# Patient Record
Sex: Female | Born: 1990 | Race: Black or African American | Hispanic: No | Marital: Single | Smoking: Never smoker
Health system: Southern US, Community
[De-identification: ages and names within clinical notes are randomized; demographics above are authoritative.]

## PROBLEM LIST (undated history)

## (undated) ENCOUNTER — Inpatient Hospital Stay (HOSPITAL_COMMUNITY): Payer: Self-pay

## (undated) DIAGNOSIS — N12 Tubulo-interstitial nephritis, not specified as acute or chronic: Secondary | ICD-10-CM

## (undated) DIAGNOSIS — F329 Major depressive disorder, single episode, unspecified: Secondary | ICD-10-CM

## (undated) DIAGNOSIS — N390 Urinary tract infection, site not specified: Secondary | ICD-10-CM

## (undated) DIAGNOSIS — F419 Anxiety disorder, unspecified: Secondary | ICD-10-CM

## (undated) DIAGNOSIS — F32A Depression, unspecified: Secondary | ICD-10-CM

## (undated) HISTORY — PX: INDUCED ABORTION: SHX677

---
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
## 2010-04-08 ENCOUNTER — Emergency Department (HOSPITAL_COMMUNITY): Admission: EM | Admit: 2010-04-08 | Discharge: 2010-04-08 | Payer: Self-pay | Admitting: Family Medicine

## 2010-04-13 ENCOUNTER — Emergency Department (HOSPITAL_COMMUNITY): Admission: EM | Admit: 2010-04-13 | Discharge: 2010-04-13 | Payer: Self-pay | Admitting: Emergency Medicine

## 2010-08-31 LAB — POCT URINALYSIS DIPSTICK
Nitrite: NEGATIVE
Protein, ur: >=300 mg/dL — AB
Urobilinogen, UA: 0.2 mg/dL (ref 0.0–1.0)
pH: 7.5 (ref 5.0–8.0)

## 2010-08-31 LAB — WET PREP, GENITAL
Trich, Wet Prep: NONE SEEN
Yeast Wet Prep HPF POC: NONE SEEN

## 2010-08-31 LAB — URINE CULTURE

## 2010-08-31 LAB — GC/CHLAMYDIA PROBE AMP, GENITAL: GC Probe Amp, Genital: NEGATIVE

## 2010-09-02 ENCOUNTER — Inpatient Hospital Stay (HOSPITAL_COMMUNITY)
Admission: AD | Admit: 2010-09-02 | Discharge: 2010-09-02 | Disposition: A | Discharge status: Home / Self Care | Payer: Medicaid Other | Source: Ambulatory Visit | Attending: Obstetrics | Admitting: Obstetrics

## 2010-09-02 DIAGNOSIS — O2390 Unspecified genitourinary tract infection in pregnancy, unspecified trimester: Secondary | ICD-10-CM | POA: Insufficient documentation

## 2010-09-02 DIAGNOSIS — R109 Unspecified abdominal pain: Secondary | ICD-10-CM | POA: Insufficient documentation

## 2010-09-02 DIAGNOSIS — A499 Bacterial infection, unspecified: Secondary | ICD-10-CM | POA: Insufficient documentation

## 2010-09-02 DIAGNOSIS — N760 Acute vaginitis: Secondary | ICD-10-CM | POA: Insufficient documentation

## 2010-09-02 DIAGNOSIS — B9689 Other specified bacterial agents as the cause of diseases classified elsewhere: Secondary | ICD-10-CM | POA: Insufficient documentation

## 2010-09-02 LAB — URINE MICROSCOPIC-ADD ON

## 2010-09-02 LAB — URINALYSIS, ROUTINE W REFLEX MICROSCOPIC
Glucose, UA: NEGATIVE mg/dL
Ketones, ur: NEGATIVE mg/dL
Protein, ur: NEGATIVE mg/dL

## 2010-09-02 LAB — WET PREP, GENITAL: Clue Cells Wet Prep HPF POC: NONE SEEN

## 2010-09-04 LAB — URINALYSIS, ROUTINE W REFLEX MICROSCOPIC
Bilirubin Urine: NEGATIVE
Hgb urine dipstick: NEGATIVE
Nitrite: NEGATIVE
Specific Gravity, Urine: 1.019 (ref 1.005–1.030)
Urobilinogen, UA: 1 mg/dL (ref 0.0–1.0)
pH: 7.5 (ref 5.0–8.0)

## 2010-09-04 LAB — CBC
HCT: 36.8 % (ref 36.0–46.0)
MCV: 92.8 fL (ref 78.0–100.0)
RBC: 3.97 MIL/uL (ref 3.87–5.11)
WBC: 7.3 10*3/uL (ref 4.0–10.5)

## 2010-09-04 LAB — URINE CULTURE
Colony Count: 50000
Colony Count: >=100000
Culture  Setup Time: 201203161043

## 2010-09-04 LAB — URINE MICROSCOPIC-ADD ON

## 2010-09-04 LAB — POCT URINALYSIS DIP (DEVICE)
Bilirubin Urine: NEGATIVE
Nitrite: NEGATIVE
Protein, ur: NEGATIVE mg/dL
Urobilinogen, UA: 0.2 mg/dL (ref 0.0–1.0)
pH: 8.5 — ABNORMAL HIGH (ref 5.0–8.0)

## 2010-09-04 LAB — WET PREP, GENITAL
Trich, Wet Prep: NONE SEEN
Yeast Wet Prep HPF POC: NONE SEEN

## 2010-09-04 LAB — DIFFERENTIAL
Eosinophils Absolute: 0.1 10*3/uL (ref 0.0–0.7)
Eosinophils Relative: 1 % (ref 0–5)
Lymphocytes Relative: 28 % (ref 12–46)
Lymphs Abs: 2 10*3/uL (ref 0.7–4.0)
Monocytes Relative: 9 % (ref 3–12)

## 2010-09-04 LAB — BASIC METABOLIC PANEL
Chloride: 106 mEq/L (ref 96–112)
GFR calc Af Amer: >60 mL/min (ref >60)
GFR calc non Af Amer: >60 mL/min (ref >60)
Potassium: 2.8 mEq/L — ABNORMAL LOW (ref 3.5–5.1)
Sodium: 134 mEq/L — ABNORMAL LOW (ref 135–145)

## 2010-09-04 LAB — POCT PREGNANCY, URINE: Preg Test, Ur: POSITIVE

## 2010-09-04 LAB — GC/CHLAMYDIA PROBE AMP, GENITAL
Chlamydia, DNA Probe: NEGATIVE
Chlamydia, DNA Probe: POSITIVE — AB
GC Probe Amp, Genital: NEGATIVE

## 2010-09-19 LAB — POCT URINALYSIS DIP (DEVICE)
Ketones, ur: NEGATIVE mg/dL
Nitrite: NEGATIVE
Protein, ur: 100 mg/dL — AB
Urobilinogen, UA: 1 mg/dL (ref 0.0–1.0)
pH: 6.5 (ref 5.0–8.0)

## 2010-09-19 LAB — URINE CULTURE

## 2010-10-01 ENCOUNTER — Observation Stay: Payer: Self-pay | Admitting: Obstetrics and Gynecology

## 2010-10-25 ENCOUNTER — Inpatient Hospital Stay (HOSPITAL_COMMUNITY)
Admission: AD | Admit: 2010-10-25 | Discharge: 2010-10-25 | Disposition: A | Discharge status: Home / Self Care | Payer: Medicaid Other | Source: Ambulatory Visit | Attending: Obstetrics | Admitting: Obstetrics

## 2010-10-25 DIAGNOSIS — O2390 Unspecified genitourinary tract infection in pregnancy, unspecified trimester: Secondary | ICD-10-CM

## 2010-10-25 DIAGNOSIS — N390 Urinary tract infection, site not specified: Secondary | ICD-10-CM

## 2010-10-25 DIAGNOSIS — R109 Unspecified abdominal pain: Secondary | ICD-10-CM

## 2010-10-25 LAB — URINALYSIS, ROUTINE W REFLEX MICROSCOPIC
Bilirubin Urine: NEGATIVE
Glucose, UA: NEGATIVE mg/dL
Ketones, ur: NEGATIVE mg/dL
Protein, ur: 100 mg/dL — AB

## 2010-10-25 LAB — URINE MICROSCOPIC-ADD ON

## 2010-10-27 LAB — URINE CULTURE: Culture  Setup Time: 201205090538

## 2010-11-16 ENCOUNTER — Inpatient Hospital Stay (HOSPITAL_COMMUNITY)
Admission: AD | Admit: 2010-11-16 | Discharge: 2010-11-16 | Disposition: A | Discharge status: Home / Self Care | Payer: Medicaid Other | Source: Ambulatory Visit | Attending: Obstetrics & Gynecology | Admitting: Obstetrics & Gynecology

## 2010-11-16 DIAGNOSIS — R109 Unspecified abdominal pain: Secondary | ICD-10-CM | POA: Insufficient documentation

## 2010-11-16 DIAGNOSIS — N390 Urinary tract infection, site not specified: Secondary | ICD-10-CM

## 2010-11-16 DIAGNOSIS — O2390 Unspecified genitourinary tract infection in pregnancy, unspecified trimester: Secondary | ICD-10-CM

## 2010-11-16 LAB — URINALYSIS, ROUTINE W REFLEX MICROSCOPIC
Bilirubin Urine: NEGATIVE
Specific Gravity, Urine: 1.015 (ref 1.005–1.030)
Urobilinogen, UA: 0.2 mg/dL (ref 0.0–1.0)

## 2010-11-16 LAB — URINE MICROSCOPIC-ADD ON

## 2010-11-24 ENCOUNTER — Inpatient Hospital Stay (HOSPITAL_COMMUNITY)
Admission: AD | Admit: 2010-11-24 | Discharge: 2010-11-27 | DRG: 775 | Disposition: A | Discharge status: Home / Self Care | Payer: Medicaid Other | Source: Ambulatory Visit | Attending: Obstetrics | Admitting: Obstetrics

## 2010-11-24 ENCOUNTER — Inpatient Hospital Stay (HOSPITAL_COMMUNITY)
Admission: EM | Admit: 2010-11-24 | Discharge: 2010-11-24 | Disposition: A | Discharge status: Home / Self Care | Payer: Medicaid Other | Source: Ambulatory Visit | Attending: Obstetrics | Admitting: Obstetrics

## 2010-11-24 ENCOUNTER — Inpatient Hospital Stay (HOSPITAL_COMMUNITY)
Admission: EM | Admit: 2010-11-24 | Discharge: 2010-11-24 | Disposition: A | Discharge status: Home / Self Care | Payer: Self-pay | Source: Ambulatory Visit | Attending: Obstetrics | Admitting: Obstetrics

## 2010-11-24 DIAGNOSIS — O479 False labor, unspecified: Secondary | ICD-10-CM | POA: Insufficient documentation

## 2010-11-25 LAB — CBC
MCV: 84.4 fL (ref 78.0–100.0)
Platelets: 293 10*3/uL (ref 150–400)
RBC: 4.04 MIL/uL (ref 3.87–5.11)
WBC: 12.4 10*3/uL — ABNORMAL HIGH (ref 4.0–10.5)

## 2010-11-25 LAB — RPR: RPR Ser Ql: NONREACTIVE

## 2010-11-26 LAB — CBC
HCT: 27.8 % — ABNORMAL LOW (ref 36.0–46.0)
Hemoglobin: 9.2 g/dL — ABNORMAL LOW (ref 12.0–15.0)
MCH: 28.1 pg (ref 26.0–34.0)
MCHC: 33.1 g/dL (ref 30.0–36.0)
MCV: 85 fL (ref 78.0–100.0)
Platelets: 228 K/uL (ref 150–400)
RBC: 3.27 MIL/uL — ABNORMAL LOW (ref 3.87–5.11)
RDW: 13.6 % (ref 11.5–15.5)
WBC: 14.2 K/uL — ABNORMAL HIGH (ref 4.0–10.5)

## 2011-03-16 LAB — POCT PREGNANCY, URINE
Operator id: 294591
Preg Test, Ur: NEGATIVE

## 2011-03-16 LAB — RPR: RPR Ser Ql: NONREACTIVE

## 2011-10-02 ENCOUNTER — Encounter (HOSPITAL_BASED_OUTPATIENT_CLINIC_OR_DEPARTMENT_OTHER): Payer: Self-pay | Admitting: *Deleted

## 2011-10-02 ENCOUNTER — Emergency Department (HOSPITAL_BASED_OUTPATIENT_CLINIC_OR_DEPARTMENT_OTHER)
Admission: EM | Admit: 2011-10-02 | Discharge: 2011-10-03 | Disposition: A | Discharge status: Home / Self Care | Payer: Medicaid Other | Attending: Emergency Medicine | Admitting: Emergency Medicine

## 2011-10-02 DIAGNOSIS — R350 Frequency of micturition: Secondary | ICD-10-CM | POA: Insufficient documentation

## 2011-10-02 DIAGNOSIS — O2390 Unspecified genitourinary tract infection in pregnancy, unspecified trimester: Secondary | ICD-10-CM | POA: Insufficient documentation

## 2011-10-02 DIAGNOSIS — O2343 Unspecified infection of urinary tract in pregnancy, third trimester: Secondary | ICD-10-CM

## 2011-10-02 DIAGNOSIS — B373 Candidiasis of vulva and vagina: Secondary | ICD-10-CM

## 2011-10-02 DIAGNOSIS — B3731 Acute candidiasis of vulva and vagina: Secondary | ICD-10-CM | POA: Insufficient documentation

## 2011-10-02 DIAGNOSIS — R109 Unspecified abdominal pain: Secondary | ICD-10-CM | POA: Insufficient documentation

## 2011-10-02 DIAGNOSIS — N390 Urinary tract infection, site not specified: Secondary | ICD-10-CM | POA: Insufficient documentation

## 2011-10-02 DIAGNOSIS — R300 Dysuria: Secondary | ICD-10-CM | POA: Insufficient documentation

## 2011-10-02 DIAGNOSIS — R3915 Urgency of urination: Secondary | ICD-10-CM | POA: Insufficient documentation

## 2011-10-02 DIAGNOSIS — N949 Unspecified condition associated with female genital organs and menstrual cycle: Secondary | ICD-10-CM | POA: Insufficient documentation

## 2011-10-02 HISTORY — DX: Urinary tract infection, site not specified: N39.0

## 2011-10-02 NOTE — ED Notes (Signed)
Pt reports symptoms began approx 1 week ago and worsened 2 days ago. C/o lower abdominal cramping with thick/white discharge. Reports swollen/irritated labia. Denies bleeding. Denies N/V/D or fevers. Pt is 23mo pregnant. EDD June 2. Pt does not currently have an OB doctor. G2P1

## 2011-10-03 LAB — URINE MICROSCOPIC-ADD ON

## 2011-10-03 LAB — URINALYSIS, ROUTINE W REFLEX MICROSCOPIC
Protein, ur: NEGATIVE mg/dL
Urobilinogen, UA: 0.2 mg/dL (ref 0.0–1.0)

## 2011-10-03 LAB — WET PREP, GENITAL: Trich, Wet Prep: NONE SEEN

## 2011-10-03 MED ORDER — CLOTRIMAZOLE 1 % VA CREA: 1.0000 | TOPICAL_CREAM | Freq: Every day | VAGINAL | Status: AC

## 2011-10-03 MED ORDER — NITROFURANTOIN MONOHYD MACRO 100 MG PO CAPS: 100.0000 mg | ORAL_CAPSULE | Freq: Two times a day (BID) | ORAL | Status: AC

## 2011-10-03 MED ORDER — NITROFURANTOIN MONOHYD MACRO 100 MG PO CAPS
100.0000 mg | ORAL_CAPSULE | Freq: Once | ORAL | Status: AC
  Administered 2011-10-03: 100 mg via ORAL
  Filled 2011-10-03: qty 1

## 2011-10-03 NOTE — ED Provider Notes (Signed)
This chart was scribed for Alisha Numbers, MD by Wallis Mart. The patient was seen in room MH07/MH07 and the patient's care was started at 12:48 AM.   CSN: [id number]  Arrival date & time 10/02/11  2327   First MD Initiated Contact with Patient 10/03/11 0006      Chief Complaint  Patient presents with  . Abdominal Pain    (Consider location/radiation/quality/duration/timing/severity/associated sxs/prior treatment) HPI   Alisha Wall is a 69 G2P1 y.o. female who presents to the Emergency Department complaining of sudden onset, persistence of constant, gradually worsening, moderate lower abdominal cramping onset one week ago. The cramping has worsened over the last 2 days.  Pt is 7 months pregnant, and her last period was in October.  Pt has not seen an OBGYN for the pregnancy, has not had an [place]. Pt c/o a thick white discharge that started one week ago as well.  Pt c/o dysuria today.   Denies bleeding, N/V/D.    There are no other associated symptoms and no other alleviating or aggravating factors.   Past Medical History  Diagnosis Date  . UTI (lower urinary tract infection)     History reviewed. No pertinent past surgical history.  No family history on file.  History  Substance Use Topics  . Smoking status: Never Smoker   . Smokeless tobacco: Not on file  . Alcohol Use: No    OB History    Grav Para Term Preterm Abortions TAB SAB Ect Mult Living   1               Review of Systems  Constitutional: Negative.   HENT: Negative.   Eyes: Negative.   Respiratory: Negative.   Cardiovascular: Negative.   Gastrointestinal: Positive for abdominal pain. Negative for nausea, vomiting, diarrhea and constipation.  Genitourinary: Positive for dysuria, urgency, frequency, vaginal discharge and vaginal pain. Negative for vaginal bleeding.  Musculoskeletal: Negative.   Skin: Negative.   Neurological: Negative.   Hematological: Negative.   Psychiatric/Behavioral: Negative.     All other systems reviewed and are negative.    10 Systems reviewed and all are negative for acute change except as noted in the HPI.    Allergies  Review of patient's allergies indicates no known allergies.  Home Medications   Current Outpatient Rx  Name Route Sig Dispense Refill  . PRENATAL MULTIVITAMIN CH Oral Take 1 tablet by mouth daily.      BP 118/73  Pulse 88  Temp(Src) 97.6 F (36.4 C) (Oral)  Resp 18  Ht 5\' 5"  (1.651 m)  Wt 146 lb (66.225 kg)  BMI 24.30 kg/m2  SpO2 100%  Physical Exam  Nursing note and vitals reviewed. Constitutional: She is oriented to person, place, and time. She appears well-developed and well-nourished. No distress.  HENT:  Head: Normocephalic and atraumatic.  Eyes: EOM are normal. Pupils are equal, round, and reactive to light.  Neck: Normal range of motion. Neck supple. No tracheal deviation present.  Cardiovascular: Normal rate and regular rhythm.   Pulmonary/Chest: Effort normal and breath sounds normal. No respiratory distress.  Abdominal: Soft. Bowel sounds are normal. She exhibits no distension. There is no tenderness.       Gravid abdomen   Genitourinary: Pelvic exam was performed with patient supine. There is tenderness on the right labia. There is tenderness on the left labia. Uterus is enlarged. Cervix exhibits no motion tenderness and no discharge. Right adnexum displays no mass and no tenderness. Left adnexum displays no mass  and no tenderness. There is erythema and tenderness around the vagina. No bleeding around the vagina. Vaginal discharge found.       Patient with gravid uterus, cervix is soft but thick and closed. Thick whitish cottage cheese like discharge  Musculoskeletal: Normal range of motion. She exhibits no edema.  Neurological: She is alert and oriented to person, place, and time. No cranial nerve deficit or sensory deficit. She exhibits normal muscle tone. Coordination normal.  Skin: Skin is warm and dry.   Psychiatric: She has a normal mood and affect. Her behavior is normal.    ED Course  Procedures (including critical care time) DIAGNOSTIC STUDIES: Oxygen Saturation is 100% on room air, normal by my interpretation.    COORDINATION OF CARE:   Labs Reviewed  URINALYSIS, ROUTINE W REFLEX MICROSCOPIC - Abnormal; Notable for the following:    APPearance CLOUDY (*)    Hgb urine dipstick SMALL (*)    Leukocytes, UA LARGE (*)    All other components within normal limits  WET PREP, GENITAL - Abnormal; Notable for the following:    Yeast Wet Prep HPF POC FEW (*)    Clue Cells Wet Prep HPF POC FEW (*)    WBC, Wet Prep HPF POC MANY (*) MANY BACTERIA SEEN   All other components within normal limits  URINE MICROSCOPIC-ADD ON - Abnormal; Notable for the following:    Squamous Epithelial / LPF MANY (*)    Bacteria, UA MANY (*)    All other components within normal limits  URINE CULTURE  GC/CHLAMYDIA PROBE AMP, GENITAL   No results found.   1. UTI (urinary tract infection) in pregnancy in third trimester   2. Yeast vaginitis       MDM  Patient was evaluated by myself. She was hemodynamically stable. Patient denied cramping being similar to prior contractions. Pelvic exam was concerning for a yeast infection. Patient was written for intravaginal cream for this. Patient's urine did show bacteriuria and the patient complained of dysuria. She was given a dose of Macrobid here and was discharged with prescription for this. Given patient's advanced stage of pregnancy quick bedside ultrasound was performed which showed a single gestation with positive fetal cardiac activity. This was performed by myself. Formal ultrasound was not available at this hour. Patient did have monitoring of the fetus performed which was transmitted to [facility]. This is felt to be unremarkable. Patient was told that she must seek an OB/GYN. She was on prenatal vitamins. Patient was discharged home in good condition  with contact information for Dr. Debroah Loop, that OB/GYN with whom I discussed her case, as well as [facility]. Patient was followed by [facility] during her previous pregnancy. Patient was discharged in good condition.   I personally performed the services described in this documentation, which was scribed in my presence. The recorded information has been reviewed and considered.        Alisha Numbers, MD 10/03/11 [phone]

## 2011-10-03 NOTE — Progress Notes (Signed)
Monitors removed by ED staff for patient discharge. Patient discharged from [facility]

## 2011-10-03 NOTE — Discharge Instructions (Signed)
Candidal Vulvovaginitis Candidal vulvovaginitis is an infection of the vagina and vulva. The vulva is the skin around the opening of the vagina. This may cause itching and discomfort in and around the vagina.  HOME CARE  Only take medicine as told by your doctor.   Do not have sex (intercourse) until the infection is healed or as told by your doctor.   Practice safe sex.   Tell your sex partner about your infection.   Do not douche or use tampons.   Wear cotton underwear. Do not wear tight pants or panty hose.   Eat yogurt. This may help treat and prevent yeast infections.  GET HELP RIGHT AWAY IF:   You have a fever.   Your problems get worse during treatment or do not get better in 3 days.   You have discomfort, irritation, or itching in your vagina or vulva area.   You have pain after sex.   You start to get belly (abdominal) pain.  MAKE SURE YOU:  Understand these instructions.   Will watch your condition.   Will get help right away if you are not doing well or get worse.  Document Released: 09/01/2008 Document Revised: 05/25/2011 Document Reviewed: 09/01/2008 [facility] Patient Information 2012 [place], [place].Urinary Tract Infection in Pregnancy A urinary tract infection (UTI) is a bacterial infection of the urinary tract. Infection of the urinary tract can include the ureters, kidneys (pyelonephritis), bladder (cystitis), and urethra (urethritis). All pregnant women should be screened for bacteria in the urinary tract. Identifying and treating a UTI will decrease the risk of preterm labor and developing more serious infections in both the mother and baby. CAUSES Bacteria germs cause almost all UTIs. There are many factors that can increase your chances of getting a UTI during pregnancy. These include:  Having a short urethra.   Poor toilet and hygiene habits.   Sexual intercourse.   Blockage of urine along the urinary tract.   Problems with the pelvic muscles or  nerves.   Diabetes.   Obesity.   Bladder problems after having several children.   Previous history of UTI.  SYMPTOMS   Pain, burning, or a stinging feeling when urinating.   Suddenly feeling the need to urinate right away (urgency).   Loss of bladder control (urinary incontinence).   Frequent urination, more than is common with pregnancy.   Lower abdominal or back discomfort.   Bad smelling urine.   Cloudy urine.   Blood in the urine (hematuria).   Fever.  When the kidneys are infected, the symptoms may be:  Back pain.   Flank pain on the right side more so than the left.   Fever.   Chills.   Nausea.   Vomiting.  DIAGNOSIS   Urine tests.   Additional tests and procedures may include:   Ultrasound of the kidneys, ureters, bladder, and urethra.   Looking in the bladder with a lighted tube (cystoscopy).   Certain X-ray studies only when absolutely necessary.  Finding out the results of your test Ask when your test results will be ready. Make sure you get your test results. TREATMENT  Antibiotic medicine by mouth.   Antibiotics given through the vein (intravenously), if needed.  HOME CARE INSTRUCTIONS   Take your antibiotics as directed. Finish them even if you start to feel better. Only take medicine as directed by your caregiver.   Drink enough fluids to keep your urine clear or pale yellow.   Do not have sexual intercourse until the  infection is gone and your caregiver says it is okay.   Make sure you are tested for UTIs throughout your pregnancy if you get one. These infections often come back.  Preventing a UTI in the future:  Practice good toilet habits. Always wipe from front to back. Use the tissue only once.   Do not hold your urine. Empty your bladder as soon as possible when the urge comes.   Do not douche or use deodorant sprays.   Wash with soap and warm water around the genital area and the anus.   Empty your bladder before  and after sexual intercourse.   Wear underwear with a cotton crotch.   Avoid caffeine and carbonated drinks. They can irritate the bladder.   Drink cranberry juice or take cranberry pills. This may decrease the risk of getting a UTI.   Do not drink alcohol.   Keep all your appointments and tests as scheduled.  SEEK MEDICAL CARE IF:   Your symptoms get worse.   You are still having fevers 2 or more days after treatment begins.   You develop a rash.   You feel that you are having problems with medicines prescribed.   You develop abnormal vaginal discharge.  SEEK IMMEDIATE MEDICAL CARE IF:   You develop back or flank pain.   You develop chills.   You have blood in your urine.   You develop nausea and vomiting.   You develop contractions of your uterus.   You have a gush of fluid from the vagina.  MAKE SURE YOU:   Understand these instructions.   Will watch your condition.   Will get help right away if you are not doing well or get worse.  Document Released: 09/30/2010 Document Revised: 05/25/2011 Document Reviewed: 09/30/2010 [facility] Patient Information 2012 [place], [place].

## 2011-10-03 NOTE — Progress Notes (Signed)
Called from [facility] for monitoring. Patient admitted to [facility] for asssessment.

## 2011-10-03 NOTE — Progress Notes (Signed)
Spoke with Lurena Joiner, RN at [facility] to further assess uterine activity seen on monitor and patient has 61 month old child sitting at bedside with movement. RN denies any breathing or change in facial expression. RN states abdomen has remained soft.

## 2011-10-03 NOTE — ED Notes (Signed)
Spoke to Alisha Wall with [facility] and state they will review the OB monitor and call back with report.

## 2011-10-04 LAB — URINE CULTURE: Culture: NO GROWTH

## 2011-10-04 LAB — GC/CHLAMYDIA PROBE AMP, GENITAL
Chlamydia, DNA Probe: POSITIVE — AB
GC Probe Amp, Genital: NEGATIVE

## 2011-10-05 NOTE — ED Notes (Signed)
+   chlamydia Chart sent to edp office for review. 

## 2011-10-07 NOTE — ED Notes (Addendum)
Chart returned from EDP office. Prescribed Azithromycin 1000mg  PO x 1. Follow-up with OB/GYN. Prescribed by Felicie Morn NPC.

## 2011-10-08 NOTE — ED Notes (Signed)
Called patient and informed them of + result and new RX. RX called to [facility] on [address] [phone]). RX called in by Norm Parcel PFM.

## 2011-10-12 ENCOUNTER — Telehealth (HOSPITAL_BASED_OUTPATIENT_CLINIC_OR_DEPARTMENT_OTHER): Payer: Self-pay | Admitting: *Deleted

## 2011-10-12 NOTE — ED Notes (Signed)
[facility] Department called requesting results of labs, urine, and ultrasound.  Chart reviewed and copy of ED notes and labs sent.  No official ultra sound done per EDP notes.

## 2011-11-15 ENCOUNTER — Inpatient Hospital Stay (HOSPITAL_COMMUNITY)
Admission: AD | Admit: 2011-11-15 | Discharge: 2011-11-15 | Disposition: A | Discharge status: Home / Self Care | Payer: Medicaid Other | Source: Ambulatory Visit | Attending: Obstetrics and Gynecology | Admitting: Obstetrics and Gynecology

## 2011-11-15 ENCOUNTER — Encounter (HOSPITAL_COMMUNITY): Payer: Self-pay | Admitting: *Deleted

## 2011-11-15 DIAGNOSIS — R109 Unspecified abdominal pain: Secondary | ICD-10-CM | POA: Insufficient documentation

## 2011-11-15 DIAGNOSIS — N949 Unspecified condition associated with female genital organs and menstrual cycle: Secondary | ICD-10-CM

## 2011-11-15 DIAGNOSIS — O99891 Other specified diseases and conditions complicating pregnancy: Secondary | ICD-10-CM | POA: Insufficient documentation

## 2011-11-15 LAB — URINALYSIS, ROUTINE W REFLEX MICROSCOPIC
Bilirubin Urine: NEGATIVE
Nitrite: NEGATIVE
Specific Gravity, Urine: <1.005 — ABNORMAL LOW (ref 1.005–1.030)
pH: 7.5 (ref 5.0–8.0)

## 2011-11-15 LAB — URINE MICROSCOPIC-ADD ON

## 2011-11-15 NOTE — MAU Provider Note (Signed)
Agree with above note.  Alisha Wall 11/15/2011 7:41 AM

## 2011-11-15 NOTE — MAU Note (Signed)
K. Shaw, CNM at bedside.  Assessment done and poc discussed with pt.  

## 2011-11-15 NOTE — MAU Note (Signed)
PT SAYS  SHE IS HAVING CRAMPS IN ABD  X3 DAYS-  DRANK WATER.  HER BACK STARTED HURTING YESTERDAY.   HER LEGS  BELOW HER KNEES   HURT X2 WEEKS.  [facility] [place]-  SEEN LAST ON 5-17-   EVERYTHING WAS OK.   SHE WENT TO [facility] ON 4-15-  FOR UTI AND TESTED POSTIVE  FOR CHLYMADIA- AND WAS TREATED.

## 2011-11-15 NOTE — MAU Provider Note (Signed)
Alisha Wall is a 21 y.o. female presenting for eval of right-sided abd cramping and leg cramping tonight. Denies leak, bldg, H/A, N/V or visual disturbances. She was dx with chlam in MAU 09/2011. She is a pt at [facility] health dept and reports routine [facility] so far. History OB History    Grav Para Term Preterm Abortions TAB SAB Ect Mult Living   2 1 1       1      Past Medical History  Diagnosis Date  . UTI (lower urinary tract infection)   . No pertinent past medical history    History reviewed. No pertinent past surgical history. Family History: family history is not on file. Social History:  reports that she has never smoked. She does not have any smokeless tobacco history on file. She reports that she does not drink alcohol or use illicit drugs.  ROS    Blood pressure 87/73, pulse 82, temperature 97.8 F (36.6 C), temperature source Oral, resp. rate 20, height 5\' 5"  (1.651 m), weight 67.359 kg (148 lb 8 oz). Maternal Exam:  Uterine Assessment: None per toco  Cervix: Cx FT/50%/-3  Fetal Exam Fetal Monitor Review: Baseline rate: 140.  Variability: moderate (6-25 bpm).   Pattern: accelerations present and no decelerations.       Physical Exam  Constitutional: She is oriented to person, place, and time. She appears well-developed and well-nourished.  HENT:  Head: Normocephalic.  Neck: Normal range of motion.  Cardiovascular: Normal rate.   Respiratory: Effort normal.  Musculoskeletal: Normal range of motion.  Neurological: She is alert and oriented to person, place, and time.  Skin: Skin is warm and dry.  Psychiatric: She has a normal mood and affect. Her behavior is normal.    Urinalysis    Component Value Date/Time   COLORURINE YELLOW 11/15/2011 0225   APPEARANCEUR CLEAR 11/15/2011 0225   LABSPEC <1.005* 11/15/2011 0225   PHURINE 7.5 11/15/2011 0225   GLUCOSEU NEGATIVE 11/15/2011 0225   HGBUR TRACE* 11/15/2011 0225   BILIRUBINUR NEGATIVE 11/15/2011 0225   KETONESUR  NEGATIVE 11/15/2011 0225   PROTEINUR NEGATIVE 11/15/2011 0225   UROBILINOGEN 0.2 11/15/2011 0225   NITRITE NEGATIVE 11/15/2011 0225   LEUKOCYTESUR LARGE* 11/15/2011 0225     Prenatal labs: ABO, Rh:   Antibody:   Rubella:   RPR: NON REACTIVE (06/08 0018)  HBsAg:    HIV:    GBS:     Assessment/Plan: IUP at 33.5 Round lig pain  D/C home with comfort tips rev'd GC/chlam pending F/U with sched prenatal appt later today     Efrat Zuidema 11/15/2011, 3:23 AM

## 2011-11-15 NOTE — Discharge Instructions (Signed)

## 2011-11-16 LAB — GC/CHLAMYDIA PROBE AMP, URINE: Chlamydia, Swab/Urine, PCR: NEGATIVE

## 2011-12-18 ENCOUNTER — Telehealth (HOSPITAL_COMMUNITY): Payer: Self-pay | Admitting: *Deleted

## 2011-12-18 NOTE — Telephone Encounter (Signed)
Preadmission screen  

## 2012-10-21 ENCOUNTER — Emergency Department (HOSPITAL_COMMUNITY)
Admission: EM | Admit: 2012-10-21 | Discharge: 2012-10-21 | Disposition: A | Discharge status: Home / Self Care | Payer: Medicaid Other | Attending: Emergency Medicine | Admitting: Emergency Medicine

## 2012-10-21 ENCOUNTER — Encounter (HOSPITAL_COMMUNITY): Payer: Self-pay | Admitting: *Deleted

## 2012-10-21 DIAGNOSIS — N760 Acute vaginitis: Secondary | ICD-10-CM

## 2012-10-21 DIAGNOSIS — Z87440 Personal history of urinary (tract) infections: Secondary | ICD-10-CM | POA: Insufficient documentation

## 2012-10-21 DIAGNOSIS — N390 Urinary tract infection, site not specified: Secondary | ICD-10-CM

## 2012-10-21 DIAGNOSIS — Z3202 Encounter for pregnancy test, result negative: Secondary | ICD-10-CM | POA: Insufficient documentation

## 2012-10-21 LAB — WET PREP, GENITAL

## 2012-10-21 LAB — LIPASE, BLOOD: Lipase: 46 U/L (ref 11–59)

## 2012-10-21 LAB — CBC WITH DIFFERENTIAL/PLATELET
Basophils Absolute: 0 10*3/uL (ref 0.0–0.1)
Basophils Relative: 0 % (ref 0–1)
Eosinophils Relative: 2 % (ref 0–5)
Lymphocytes Relative: 22 % (ref 12–46)
MCHC: 36.1 g/dL — ABNORMAL HIGH (ref 30.0–36.0)
Neutro Abs: 5.3 10*3/uL (ref 1.7–7.7)
Platelets: 329 10*3/uL (ref 150–400)
RDW: 12.1 % (ref 11.5–15.5)
WBC: 7.7 10*3/uL (ref 4.0–10.5)

## 2012-10-21 LAB — URINALYSIS, ROUTINE W REFLEX MICROSCOPIC
Bilirubin Urine: NEGATIVE
Ketones, ur: NEGATIVE mg/dL
Nitrite: NEGATIVE
Protein, ur: NEGATIVE mg/dL
Urobilinogen, UA: 1 mg/dL (ref 0.0–1.0)
pH: 6.5 (ref 5.0–8.0)

## 2012-10-21 LAB — BASIC METABOLIC PANEL
CO2: 28 mEq/L (ref 19–32)
Calcium: 9.7 mg/dL (ref 8.4–10.5)
Creatinine, Ser: 0.62 mg/dL (ref 0.50–1.10)
GFR calc Af Amer: >90 mL/min (ref >90)
Sodium: 138 mEq/L (ref 135–145)

## 2012-10-21 LAB — URINE MICROSCOPIC-ADD ON

## 2012-10-21 MED ORDER — SULFAMETHOXAZOLE-TRIMETHOPRIM 800-160 MG PO TABS: 1.0000 | ORAL_TABLET | Freq: Two times a day (BID) | ORAL | Status: DC

## 2012-10-21 MED ORDER — SULFAMETHOXAZOLE-TMP DS 800-160 MG PO TABS
1.0000 | ORAL_TABLET | Freq: Once | ORAL | Status: AC
  Administered 2012-10-21: 1 via ORAL
  Filled 2012-10-21: qty 1

## 2012-10-21 MED ORDER — IBUPROFEN 800 MG PO TABS
800.0000 mg | ORAL_TABLET | Freq: Once | ORAL | Status: AC
  Administered 2012-10-21: 800 mg via ORAL
  Filled 2012-10-21: qty 1

## 2012-10-21 MED ORDER — METRONIDAZOLE 500 MG PO TABS
500.0000 mg | ORAL_TABLET | Freq: Once | ORAL | Status: AC
  Administered 2012-10-21: 500 mg via ORAL
  Filled 2012-10-21: qty 1

## 2012-10-21 MED ORDER — ACETAMINOPHEN 325 MG PO TABS
650.0000 mg | ORAL_TABLET | Freq: Once | ORAL | Status: AC
  Administered 2012-10-21: 650 mg via ORAL
  Filled 2012-10-21: qty 2

## 2012-10-21 MED ORDER — METRONIDAZOLE 500 MG PO TABS: 500.0000 mg | ORAL_TABLET | Freq: Two times a day (BID) | ORAL | Status: DC

## 2012-10-21 NOTE — ED Provider Notes (Signed)
History     CSN: [id number]  Arrival date & time 10/21/12  1327   First MD Initiated Contact with Patient 10/21/12 1541      Chief Complaint  Patient presents with  . Abdominal Pain    (Consider location/radiation/quality/duration/timing/severity/associated sxs/prior treatment) The history is provided by the patient.  Alisha Wall is a 22 y.o. female history of UTI here presenting with lower abnormal pain. Lower quadrant pain for the last month. It's intermittent and crampy feeling. Last menstrual period was 2 weeks ago. She look up this morning around 5 AM with worsening right lower quadrant pain that is sharp. Denies any fevers chills or vomiting. She said the pain slightly improved now. She also had mild dysuria but no frequency or hematuria. No vaginal discharge.    Past Medical History  Diagnosis Date  . UTI (lower urinary tract infection)   . No pertinent past medical history     History reviewed. No pertinent past surgical history.  No family history on file.  History  Substance Use Topics  . Smoking status: Never Smoker   . Smokeless tobacco: Not on file  . Alcohol Use: No    OB History   Grav Para Term Preterm Abortions TAB SAB Ect Mult Living   2 1 1       1       Review of Systems  Gastrointestinal: Positive for abdominal pain.  Genitourinary: Positive for dysuria.  All other systems reviewed and are negative.    Allergies  Review of patient's allergies indicates no known allergies.  Home Medications   Current Outpatient Rx  Name  Route  Sig  Dispense  Refill  . acetaminophen (TYLENOL) 325 MG tablet   Oral   Take 325 mg by mouth every 6 (six) hours as needed for pain.           BP 110/61  Pulse 79  Temp(Src) 98.3 F (36.8 C) (Oral)  Resp 18  SpO2 98%  LMP 10/06/2012  Physical Exam  Nursing note and vitals reviewed. Constitutional: She is oriented to person, place, and time. She appears well-developed and well-nourished.  HENT:   Head: Normocephalic.  Mouth/Throat: Oropharynx is clear and moist.  Eyes: Conjunctivae are normal. Pupils are equal, round, and reactive to light.  Neck: Normal range of motion. Neck supple.  Cardiovascular: Normal rate, regular rhythm and normal heart sounds.   Pulmonary/Chest: Effort normal and breath sounds normal. No respiratory distress. She has no wheezes. She has no rales.  Abdominal: Soft. Bowel sounds are normal.  Mild R pelvic tenderness vs suprapubic tenderness. no tenderness periumbilical area or mcburney's point. No CVAT   Genitourinary:  Whitish discharge from vagina, cottage cheese appearance. No CMT or adnexal tenderness. + suprapubic tenderness   Musculoskeletal: Normal range of motion. She exhibits no edema and no tenderness.  Neurological: She is alert and oriented to person, place, and time.  Skin: Skin is warm and dry.  Psychiatric: She has a normal mood and affect. Her behavior is normal. Judgment and thought content normal.    ED Course  Procedures (including critical care time)  Labs Reviewed  WET PREP, GENITAL - Abnormal; Notable for the following:    Clue Cells Wet Prep HPF POC MANY (*)    WBC, Wet Prep HPF POC MODERATE (*)    All other components within normal limits  CBC WITH DIFFERENTIAL - Abnormal; Notable for the following:    MCHC 36.1 (*)    All other components  within normal limits  URINALYSIS, ROUTINE W REFLEX MICROSCOPIC - Abnormal; Notable for the following:    APPearance HAZY (*)    Specific Gravity, Urine 1.031 (*)    Leukocytes, UA MODERATE (*)    All other components within normal limits  URINE MICROSCOPIC-ADD ON - Abnormal; Notable for the following:    Squamous Epithelial / LPF FEW (*)    Bacteria, UA FEW (*)    All other components within normal limits  URINE CULTURE  GC/CHLAMYDIA PROBE AMP  BASIC METABOLIC PANEL  LIPASE, BLOOD  POCT PREGNANCY, URINE   No results found.   No diagnosis found.    MDM  Alisha Wall is a  22 y.o. female here with lower pelvic pain. Likely ruptured cyst vs UTI. Not concerned for appendicitis given no fever or WBC count and the chronicity of symptoms. Labs otherwise unremarkable. UCG neg. Will get wet prep to r/o yeast vs BV.   5:20 PM Wet prep showed + WBC and clue cells no yeast. Likely has BV. Given flagyl here. Will d/c home on bactrim and flagyl.        Richardean Canal, MD 10/21/12 [phone]

## 2012-10-21 NOTE — ED Notes (Signed)
Pt is here with RLQ abdominal pain for one month.  Pt states that at 0500 had sharp pains that made her cry.  Pt reports also having frequent chest pain.  LMP-10/06/12

## 2012-10-22 LAB — GC/CHLAMYDIA PROBE AMP: GC Probe RNA: NEGATIVE

## 2012-10-22 LAB — URINE CULTURE: Colony Count: 8000

## 2012-10-23 NOTE — ED Notes (Signed)
+  Chlamydia Chart sent to EDP office for review.  

## 2012-10-28 ENCOUNTER — Telehealth (HOSPITAL_COMMUNITY): Payer: Self-pay | Admitting: Emergency Medicine

## 2012-10-29 ENCOUNTER — Telehealth (HOSPITAL_COMMUNITY): Payer: Self-pay | Admitting: Emergency Medicine

## 2012-11-02 ENCOUNTER — Telehealth (HOSPITAL_COMMUNITY): Payer: Self-pay | Admitting: Emergency Medicine

## 2012-11-02 NOTE — ED Notes (Signed)
Unable to contact patient via phone. Sent letter. °

## 2012-12-28 ENCOUNTER — Telehealth (HOSPITAL_COMMUNITY): Payer: Self-pay | Admitting: Emergency Medicine

## 2012-12-28 NOTE — ED Notes (Signed)
No response to letter sent after 30 days. Chart sent to Medical Records. °

## 2013-09-04 ENCOUNTER — Inpatient Hospital Stay (HOSPITAL_COMMUNITY)
Admission: AD | Admit: 2013-09-04 | Discharge: 2013-09-04 | Disposition: A | Discharge status: Home / Self Care | Payer: Medicaid Other | Source: Ambulatory Visit | Attending: Obstetrics & Gynecology | Admitting: Obstetrics & Gynecology

## 2013-09-04 ENCOUNTER — Inpatient Hospital Stay (HOSPITAL_COMMUNITY): Payer: Medicaid Other

## 2013-09-04 ENCOUNTER — Encounter (HOSPITAL_COMMUNITY): Payer: Self-pay | Admitting: Emergency Medicine

## 2013-09-04 ENCOUNTER — Encounter (HOSPITAL_COMMUNITY): Payer: Self-pay | Admitting: *Deleted

## 2013-09-04 ENCOUNTER — Emergency Department (HOSPITAL_COMMUNITY)
Admission: EM | Admit: 2013-09-04 | Discharge: 2013-09-04 | Disposition: A | Discharge status: Home / Self Care | Payer: Medicaid Other | Source: Home / Self Care | Attending: Emergency Medicine | Admitting: Emergency Medicine

## 2013-09-04 ENCOUNTER — Other Ambulatory Visit (HOSPITAL_COMMUNITY)
Admission: RE | Admit: 2013-09-04 | Discharge: 2013-09-04 | Disposition: A | Discharge status: Home / Self Care | Payer: Medicaid Other | Source: Ambulatory Visit | Attending: Emergency Medicine | Admitting: Emergency Medicine

## 2013-09-04 DIAGNOSIS — Z3490 Encounter for supervision of normal pregnancy, unspecified, unspecified trimester: Secondary | ICD-10-CM

## 2013-09-04 DIAGNOSIS — Z113 Encounter for screening for infections with a predominantly sexual mode of transmission: Secondary | ICD-10-CM | POA: Insufficient documentation

## 2013-09-04 DIAGNOSIS — O418X90 Other specified disorders of amniotic fluid and membranes, unspecified trimester, not applicable or unspecified: Secondary | ICD-10-CM

## 2013-09-04 DIAGNOSIS — N9489 Other specified conditions associated with female genital organs and menstrual cycle: Secondary | ICD-10-CM | POA: Insufficient documentation

## 2013-09-04 DIAGNOSIS — O2340 Unspecified infection of urinary tract in pregnancy, unspecified trimester: Secondary | ICD-10-CM

## 2013-09-04 DIAGNOSIS — R109 Unspecified abdominal pain: Secondary | ICD-10-CM | POA: Insufficient documentation

## 2013-09-04 DIAGNOSIS — O2390 Unspecified genitourinary tract infection in pregnancy, unspecified trimester: Secondary | ICD-10-CM | POA: Insufficient documentation

## 2013-09-04 DIAGNOSIS — Z3201 Encounter for pregnancy test, result positive: Secondary | ICD-10-CM

## 2013-09-04 DIAGNOSIS — N760 Acute vaginitis: Secondary | ICD-10-CM | POA: Insufficient documentation

## 2013-09-04 DIAGNOSIS — N949 Unspecified condition associated with female genital organs and menstrual cycle: Secondary | ICD-10-CM | POA: Insufficient documentation

## 2013-09-04 DIAGNOSIS — O468X9 Other antepartum hemorrhage, unspecified trimester: Secondary | ICD-10-CM

## 2013-09-04 DIAGNOSIS — O208 Other hemorrhage in early pregnancy: Secondary | ICD-10-CM | POA: Insufficient documentation

## 2013-09-04 DIAGNOSIS — N390 Urinary tract infection, site not specified: Secondary | ICD-10-CM

## 2013-09-04 LAB — POCT URINALYSIS DIP (DEVICE)
Bilirubin Urine: NEGATIVE
Glucose, UA: NEGATIVE mg/dL
Ketones, ur: NEGATIVE mg/dL
Nitrite: NEGATIVE
PROTEIN: NEGATIVE mg/dL
SPECIFIC GRAVITY, URINE: 1.015 (ref 1.005–1.030)
UROBILINOGEN UA: 0.2 mg/dL (ref 0.0–1.0)
pH: 7 (ref 5.0–8.0)

## 2013-09-04 LAB — POCT PREGNANCY, URINE: Preg Test, Ur: POSITIVE — AB

## 2013-09-04 LAB — HCG, QUANTITATIVE, PREGNANCY: HCG, BETA CHAIN, QUANT, S: 71264 m[IU]/mL — AB (ref <5)

## 2013-09-04 MED ORDER — CEPHALEXIN 500 MG PO CAPS: 500.0000 mg | ORAL_CAPSULE | Freq: Three times a day (TID) | ORAL | Status: DC

## 2013-09-04 NOTE — ED Notes (Signed)
Pt. Instructed to go to [facility] now.  Dr. Lorenz CoasterKeller called report. 09/04/2013

## 2013-09-04 NOTE — ED Notes (Signed)
C/o abd pain for two weeks Denies any bowel or bladder problems

## 2013-09-04 NOTE — MAU Note (Signed)
Pt sent from urgent care. C/O abd pan x2 weeks. Did pelvic exam . Sent for "follow up"

## 2013-09-04 NOTE — ED Provider Notes (Signed)
Chief Complaint   Chief Complaint  Patient presents with  . Abdominal Pain    History of Present Illness   Alisha Wall is a 23 year old female who has had a one-week history of generalized myalgias, intermittent abdominal pain localized to the right flank and the lower abdomen, nausea, vomiting, dizziness, and constipation. She is just getting over an upper respiratory infection with sore throat, rhinorrhea, and fever. This was about 4 days ago and she's getting better right now. Her last menstrual period was January 15 she had a positive home pregnancy test. She denies any blood in the stool urinary symptoms. She is a gravida 3 para 2.  Review of Systems   Other than as noted above, the patient denies any of the following symptoms: Systemic:  No fever or chills GI:  No abdominal pain, nausea, vomiting, diarrhea, constipation, melena or hematochezia. GU:  No dysuria, frequency, urgency, hematuria, vaginal discharge, itching, or abnormal vaginal bleeding.  PMFSH   Past medical history, family history, social history, meds, and allergies were reviewed.    Physical Examination    Vital signs:  BP 99/60  Pulse 70  Temp(Src) 97.6 F (36.4 C) (Oral)  Resp 16  SpO2 100%  LMP 10/06/2012 General:  Alert, oriented and in no distress. Lungs:  Breath sounds clear and equal bilaterally.  No wheezes, rales or rhonchi. Heart:  Regular rhythm.  No gallops or murmers. Abdomen:  Soft, flat and non-distended.  No organomegaly or mass.  She has mild, generalized tenderness to palpation without guarding or rebound.  Bowel sounds normally active. Pelvic exam:  Normal external genitalia, vaginal and cervical mucosa were normal. There is a moderate amount of white discharge. She has mild cervical motion tenderness. Uterus is normal in size and shape and nontender. She has bilateral adnexal tenderness worse in the left in the right with no mass.  DNA probes for gonorrhea, Chlamydia, Trichomonas,  Gardnerella, Candida were obtained. Skin:  Clear, warm and dry.  Labs   Results for orders placed during the hospital encounter of 09/04/13  POCT URINALYSIS DIP (DEVICE)      Result Value Ref Range   Glucose, UA NEGATIVE  NEGATIVE mg/dL   Bilirubin Urine NEGATIVE  NEGATIVE   Ketones, ur NEGATIVE  NEGATIVE mg/dL   Specific Gravity, Urine 1.015  1.005 - 1.030   Hgb urine dipstick TRACE (*) NEGATIVE   pH 7.0  5.0 - 8.0   Protein, ur NEGATIVE  NEGATIVE mg/dL   Urobilinogen, UA 0.2  0.0 - 1.0 mg/dL   Nitrite NEGATIVE  NEGATIVE   Leukocytes, UA LARGE (*) NEGATIVE  POCT PREGNANCY, URINE      Result Value Ref Range   Preg Test, Ur POSITIVE (*) NEGATIVE    Urine was cultured.   AsIsessment   The primary encounter diagnosis was Pregnancy. A diagnosis of UTI (lower urinary tract infection) was also pertinent to this visit.  She is in early stages of pregnancy and has some pelvic pain. She needs to have an ultrasound to rule out an ectopic pregnancy. [facility] report was called, her husband is with her and will transport her immediately.       Plan    1.  Meds:  The following meds were prescribed:   New Prescriptions   CEPHALEXIN (KEFLEX) 500 MG CAPSULE    Take 1 capsule (500 mg total) by mouth 3 (three) times daily.    2.  Patient Education/Counseling:  The patient was given appropriate handouts, self care instructions, and instructed in symptomatic relief.    3.  Follow up:  The patient was told to follow up here if no better in 3 to 4 days, or sooner if becoming worse in any way, and given some red flag symptoms such as worsening pain, fever, persistent vomiting, or heavy vaginal bleeding which would prompt immediate return.  Followup at [facility] for her prenatal care.     Reuben Likes, MD 09/04/13 [phone]

## 2013-09-04 NOTE — Discharge Instructions (Signed)
[facility]--do not eat or drink.   Nausea and vomiting are common during the first 3 months of pregnancy.  This will almost always get better as pregnancy progresses.  There are a number of things you can do to decrease symptoms:   Avoidance of large meals and consumption of low-fat, low-fiber, bland foods (e.g., breads, crackers, cereals, eggs, tofu, lean meat, peanut butter, fruits, vegetables). Avoidance of foods with strong smells and those with increased protein and liquid content is often recommended. Eating light food (such as saltine crackers) early in the morning before getting out of bed can be helpful.   Ginger extract has been shown to be helpful.  The dose is 125 to 250 mg every 6 hours.  Ginger ale or ginger tea may be an acceptable alternative.   The first line medications for morning sickness are a combination of Vitamin B 6 (Pyridoxine ) 10 to 25 mg every 8 hours.  (Side effects are numbness, headache and fatigue) plus Doxylamine (Unisom Sleep Tabs) 12.5 - 25 mg every 8 hours (Side effect is drowsiness).  It has been established that this is safe to take during pregnancy.   There are a number of prescription medications available, so if symptoms persist despite these treatments, see your doctor.   Persistent vomiting especially with severe weakness or dizziness may indicate dehydration.  If this happens, you should go to [facility].   Before [facility] Ask any questions about feeding, diapering, and baby care before you leave the hospital. Ask again if you do not understand. Ask when you need to see the doctor again. There are several things you must have before your baby comes home. Infant car seat. Crib. Do not let your baby sleep in a bed with you or anyone else. If you do not have a bed for your baby, ask the doctor what you can use that will be safe for the baby to sleep in. Infant feeding supplies: 6 to 8 bottles (8 oz. size). 6 to 8  nipples. Measuring cup. Measuring tablespoon. Bottle brush. Sterilizer (or use any large pan or kettle with a lid). Formula that contains iron. A way to boil and cool water. Breastfeeding supplies: Breast pump. Nipple cream. Clothing: 24 to 36 cloth diapers and waterproof diaper covers or a box of disposable diapers. You may need as many as 10 to 12 diapers per day. 3 onesies (other clothing will depend on the time of year and the weather). 3 receiving blankets. 3 baby pajamas or gowns. 3 bibs. Bath equipment: Mild soap. Petroleum jelly. No baby oil or powder. Soft cloth towel and wash cloth. Cotton balls. Separate bath basin for baby. Only sponge bathe until umbilical cord and circumcision are healed. Other supplies: Thermometer and bulb syringe (ask the hospital to send them home with you). Ask your doctor about how you should take your baby's temperature. One to two pacifiers. Prepare for an emergency: Know how to get to the hospital and know where to admit your baby. Put all doctor numbers near your house phone and in your cell phone if you have one. Prepare your family: Talk with siblings about the baby coming home and how they feel about it. Decide how you want to handle visitors and other family members. Take offers for help with the baby. You will need time to adjust. Know when to call the doctor.  GET HELP RIGHT AWAY IF: Your baby's temperature is greater than 100.4 F (38 C).  The softspot on your baby's head starts to bulge. Your baby is crying with no tears or has no wet diapers for 6 hours. Your baby has rapid breathing. Your baby is not as alert. Document Released: 05/18/2008 Document Revised: 08/28/2011 Document Reviewed: 08/25/2010 [facility] Patient Information 2014 [place], [place].  Pregnancy, First Trimester The first trimester is the first 3 months your baby is growing inside you. It is important to follow your doctor's instructions. HOME CARE  Do not  smoke. Do not drink alcohol. Only take medicine as told by your doctor. Exercise. Eat healthy foods. Eat regular, well-balanced meals. You can have sex (intercourse) if there are no other problems with the pregnancy. Things that help with morning sickness: Eat soda crackers before getting up in the morning. Eat 4 to 5 small meals rather than 3 large meals. Drink liquids between meals, not during meals. Go to all appointments as told. Take all vitamins or supplements as told by your doctor. GET HELP RIGHT AWAY IF:  You develop a fever. You have a bad smelling fluid that is leaking from your vagina. There is bleeding from the vagina. You develop severe belly (abdominal) or back pain. You throw up (vomit) blood. It may look like coffee grounds. You lose more than 2 pounds in a week. You gain 5 pounds or more in a week. You gain more than 2 pounds in a week and you see puffiness (swelling) in your feet, ankles, or legs. You have severe dizziness or pass out (faint). You are around people who have [place] measles, chickenpox, or fifth disease. You have a headache, watery poop (diarrhea), pain with peeing (urinating), or cannot breath right. Document Released: 11/22/2007 Document Revised: 08/28/2011 Document Reviewed: 11/22/2007 [facility] Patient Information 2014 [place], [place].  Pregnancy If you are planning on getting pregnant, it is a good idea to make a preconception appointment with your caregiver to discuss having a healthy lifestyle before getting pregnant. This includes diet, weight, exercise, taking prenatal vitamins (especially folic acid, which helps prevent brain and spinal cord defects), avoiding alcohol, smoking and illegal drugs, medical problems (diabetes, convulsions), family history of genetic problems, working conditions, and immunizations. It is better to have knowledge of these things and do something about them before getting pregnant. During your pregnancy, it is  important to follow certain guidelines in order to have a healthy baby. It is very important to get good prenatal care and follow your caregiver's instructions. Prenatal care includes all the medical care you receive before your baby's birth. This helps to prevent problems during the pregnancy and childbirth. HOME CARE INSTRUCTIONS  Start your prenatal visits by the 12th week of pregnancy or earlier, if possible. At first, appointments are usually scheduled monthly. They become more frequent in the last 2 months before delivery. It is important that you keep your caregiver's appointments and follow your caregiver's instructions regarding medication use, exercise, and diet. During pregnancy, you are providing food for you and your baby. Eat a regular, well-balanced diet. Choose foods such as meat, fish, milk and other dairy products, vegetables, fruits, whole-grain breads and cereals. Your caregiver will inform you of the ideal weight gain depending on your current height and weight. Drink lots of liquids. Try to drink 8 glasses of water a day. Alcohol is associated with a number of birth defects including fetal alcohol syndrome. It is best to avoid alcohol completely. Smoking will cause low birth rate and prematurity. Use of alcohol and nicotine during your pregnancy also increases the  chances that your child will be chemically dependent later in their life and may contribute to SIDS (Sudden Infant Death Syndrome). Do not use illegal drugs. Only take prescription or over-the-counter medications that are recommended by your caregiver. Other medications can cause genetic and physical problems in the baby. Morning sickness can often be helped by keeping soda crackers at the bedside. Eat a few before getting up in the morning. A sexual relationship may be continued until near the end of pregnancy if there are no other problems such as early (premature) leaking of amniotic fluid from the membranes, vaginal  bleeding, painful intercourse or belly (abdominal) pain. Exercise regularly. Check with your caregiver if you are unsure of the safety of some of your exercises. Do not use hot tubs, steam rooms or saunas. These increase the risk of fainting and hurting yourself and the baby. Swimming is OK for exercise. Get plenty of rest, including afternoon naps when possible, especially in the third trimester. Avoid toxic odors and chemicals. Do not wear high heels. They may cause you to lose your balance and fall. Do not lift over 5 pounds. If you do lift anything, lift with your legs and thighs, not your back. Avoid long trips, especially in the third trimester. If you have to travel out of the city or state, take a copy of your medical records with you. SEEK IMMEDIATE MEDICAL CARE IF:  You develop an unexplained oral temperature above 102 F (38.9 C), or as your caregiver suggests. You have leaking of fluid from the vagina. If leaking membranes are suspected, take your temperature and inform your caregiver of this when you call. There is vaginal spotting or bleeding. Notify your caregiver of the amount and how many pads are used. You continue to feel sick to your stomach (nauseous) and have no relief from remedies suggested, or you throw up (vomit) blood or coffee ground like materials. You develop upper abdominal pain. You have round ligament discomfort in the lower abdominal area. This still must be evaluated by your caregiver. You feel contractions of the uterus. You do not feel the baby move, or there is less movement than before. You have painful urination. You have abnormal vaginal discharge. You have persistent diarrhea. You get a severe headache. You have problems with your vision. You develop muscle weakness. You feel dizzy and faint. You develop shortness of breath. You develop chest pain. You have back pain that travels down to your leg and feet. You feel irregular or a very fast  heartbeat. You develop excessive weight gain in a short period of time (5 pounds in 3 to 5 days). You are involved in a domestic violence situation. Document Released: 06/05/2005 Document Revised: 12/05/2011 Document Reviewed: 11/27/2008 [facility] Patient Information 2014 [place], [place].

## 2013-09-04 NOTE — MAU Provider Note (Signed)
History     CSN: [id number]  Arrival date and time: 09/04/13 1810   None     Chief Complaint  Patient presents with  . Abdominal Pain   HPI  Pt is a 23 yo G2P1001 at [redacted]w[redacted]d wks IUP sent from urgent care for ultrasound.  Pt reports lower mid-pelvic pain that started two weeks ago and that has gotten progressively worse.  Pain is described as pressure.  Pt denies vaginal bleeding or abnormal discharge.  Large leukocytes on UA at urgent care, being sent for culture. GC/CT and wet prep collected at urgent care and sent to lab.  Pt reports nausea, vomiting, and diarrhea over a week ago, but none today.    Past Medical History  Diagnosis Date  . UTI (lower urinary tract infection)   . No pertinent past medical history     No past surgical history on file.  No family history on file.  History  Substance Use Topics  . Smoking status: Never Smoker   . Smokeless tobacco: Not on file  . Alcohol Use: No    Allergies: No Known Allergies  Prescriptions prior to admission  Medication Sig Dispense Refill  . acetaminophen (TYLENOL) 325 MG tablet Take 325 mg by mouth every 6 (six) hours as needed for pain.      . cephALEXin (KEFLEX) 500 MG capsule Take 1 capsule (500 mg total) by mouth 3 (three) times daily.  30 capsule  0  . metroNIDAZOLE (FLAGYL) 500 MG tablet Take 1 tablet (500 mg total) by mouth 2 (two) times daily. One po bid x 7 days  14 tablet  0  . sulfamethoxazole-trimethoprim (SEPTRA DS) 800-160 MG per tablet Take 1 tablet by mouth every 12 (twelve) hours.  10 tablet  0    Review of Systems  Constitutional: Negative for fever.  Gastrointestinal: Positive for abdominal pain (pelvic pressure). Negative for nausea, vomiting and diarrhea.  Genitourinary: Negative for dysuria.   Physical Exam   Blood pressure 101/63, pulse 68, temperature 98.5 F (36.9 C), temperature source Oral, resp. rate 18, height 5' 5.5" (1.664 m), last menstrual period 07/03/2013.  Physical Exam   Constitutional: She is oriented to person, place, and time. She appears well-developed and well-nourished. No distress.  HENT:  Head: Normocephalic.  Neck: Normal range of motion. Neck supple.  Cardiovascular: Normal rate, regular rhythm and normal heart sounds.   Respiratory: Effort normal and breath sounds normal. No respiratory distress.  GI: Soft. She exhibits no mass. There is no tenderness. There is no rebound and no guarding.  Genitourinary: Uterus is enlarged. Right adnexum displays no mass, no tenderness and no fullness. Left adnexum displays no mass, no tenderness and no fullness. No bleeding around the vagina.  Musculoskeletal: Normal range of motion.  Neurological: She is alert and oriented to person, place, and time.  Skin: Skin is warm and dry.    MAU Course  Procedures Results for orders placed during the hospital encounter of 09/04/13 (from the past 24 hour(s))  HCG, QUANTITATIVE, PREGNANCY     Status: Abnormal   Collection Time    09/04/13  6:42 PM      Result Value Ref Range   hCG, Beta Chain, Quant, [place] [address] (*) <5 mIU/mL   Ultrasound: FINDINGS:  Intrauterine gestational sac: Visualized/normal in shape.  Yolk sac: Present  Embryo: Present  Cardiac Activity: Present  Heart Rate: 136 bpm  CRL: 10.3 mm 7 w 1 d [place] EDC: 04/22/2014  Maternal uterus/adnexae:  Small subchorionic  hemorrhage.  No free pelvic fluid.  Left ovary normal size and morphology 3.6 x 2.2 x 2.1 cm.  Right ovary normal size and morphology 3.6 x 2.2 1.9 cm.  Anechoic tubular structure in right adnexa, uncertain etiology.  This is external to the uterus, unrelated to the intrauterine  gestation, differential diagnosis including bladder diverticulum,  hydrosalpinx, loculated pelvic fluid.  No additional pelvic masses.  IMPRESSION:  Single live intrauterine gestational measured at 7 weeks 1 day EGA  by crown-rump length.  Small subchorionic hemorrhage.  Anechoic tubular structure in the right  adnexal a, 6.0 x 2.4 cm in  size, question related to hydrosalpinx, unusual bladder  diverticulum, loculated pelvic fluid collection, uncertain etiology.   Consulted with Dr. Debroah LoopArnold > reviewed HPI/exam/labs/ultrasound > begin prenatal care will follow-up on possible hydrosalpinx later in pregnancy.   Assessment and Plan  23 yo G4P2012 at 6670w1d wks IUP Right Adnexal Mass Subchorionic Hemorrhage UTI  Plan: Discharge to home Continue Keflex, urine culture pending GC/CT and wet prep pending. Begin prenatal care Follow-up ultrasound in 4-6 weeks. Follow-up if no improvement in pain or worsening of symptoms.     [facility]MAD,Shakirah Kirkey 09/04/2013, 6:47 PM

## 2013-09-05 LAB — CERVICOVAGINAL ANCILLARY ONLY
Chlamydia: NEGATIVE
Neisseria Gonorrhea: NEGATIVE
WET PREP (BD AFFIRM): NEGATIVE
Wet Prep (BD Affirm): NEGATIVE
Wet Prep (BD Affirm): POSITIVE — AB

## 2013-09-05 NOTE — ED Notes (Signed)
GC/Chlamydia neg., Affirm: Candida and Trich neg., Gardnerella pos., Urine culture pending.  [facility]  No treatment noted.  Message sent to Dr. Lorenz CoasterKeller. Vassie MoselleYork, Jansel Vonstein M 09/05/2013

## 2013-09-06 LAB — URINE CULTURE: Colony Count: >=100000

## 2013-09-07 ENCOUNTER — Telehealth (HOSPITAL_COMMUNITY): Payer: Self-pay | Admitting: *Deleted

## 2013-09-07 ENCOUNTER — Telehealth (HOSPITAL_COMMUNITY): Payer: Self-pay | Admitting: Emergency Medicine

## 2013-09-07 MED ORDER — METRONIDAZOLE 500 MG PO TABS: 500.0000 mg | ORAL_TABLET | Freq: Two times a day (BID) | ORAL | Status: DC

## 2013-09-07 NOTE — ED Notes (Signed)
The patient's DNA probe came back positive for Gardnerella. She will need metronidazole 500 mg, #14, 1 twice a day for one week. This will be sent to her pharmacy. We will need to call her and let her know these results.   Reuben Likesavid C Ginna Schuur, MD 09/07/13 [phone]

## 2013-09-07 NOTE — Telephone Encounter (Signed)
Message copied by Reuben LikesKELLER, Gryffin Altice C on Sun Sep 07, 2013  8:45 AM ------      Message from: Vassie MoselleYORK, SUZANNE M      Created: Fri Sep 05, 2013  4:39 PM      Regarding: labs       Gardnerella pos. Rest of labs neg., Urine cx.-pending.  [facility]  They did not treat.      Vassie MoselleYork, Suzanne M      09/05/2013       ------

## 2013-09-07 NOTE — ED Notes (Addendum)
Urine culture: Group B Strep (S. Agalactiae).  Pt. Adequately treated with Keflex per Dr. Lorenz CoasterKeller.  I called pt. Pt. verified x 2 and given results.  Pt. told she was adequately treated with Keflex for UTI and to get it rechecked if still having symptoms after she has finished her antibiotic. Pt. told she needs Flagyl for bacterial vaginosis and where to pick up her Rx.  Pt. asked how to take both medicines without getting sick. I told her to how to space them out, so she is not taking both at the same time.  Pt. voiced understanding. Vassie MoselleYork, Alisha Wall M 09/07/2013

## 2013-09-07 NOTE — Progress Notes (Signed)
Quick Note:  Results are abnormal as noted, but have been adequately treated with cephalexin. No further action necessary. ______

## 2013-12-09 ENCOUNTER — Encounter (HOSPITAL_COMMUNITY): Payer: Self-pay

## 2014-01-04 ENCOUNTER — Emergency Department (HOSPITAL_COMMUNITY)
Admission: EM | Admit: 2014-01-04 | Discharge: 2014-01-05 | Disposition: A | Discharge status: Home / Self Care | Payer: Medicaid Other | Attending: Emergency Medicine | Admitting: Emergency Medicine

## 2014-01-04 ENCOUNTER — Encounter (HOSPITAL_COMMUNITY): Payer: Self-pay | Admitting: Emergency Medicine

## 2014-01-04 DIAGNOSIS — N7093 Salpingitis and oophoritis, unspecified: Secondary | ICD-10-CM | POA: Diagnosis not present

## 2014-01-04 DIAGNOSIS — Z3202 Encounter for pregnancy test, result negative: Secondary | ICD-10-CM | POA: Insufficient documentation

## 2014-01-04 DIAGNOSIS — N730 Acute parametritis and pelvic cellulitis: Secondary | ICD-10-CM | POA: Diagnosis not present

## 2014-01-04 DIAGNOSIS — Z87440 Personal history of urinary (tract) infections: Secondary | ICD-10-CM | POA: Diagnosis not present

## 2014-01-04 DIAGNOSIS — R1031 Right lower quadrant pain: Secondary | ICD-10-CM | POA: Diagnosis present

## 2014-01-04 LAB — PREGNANCY, URINE: PREG TEST UR: NEGATIVE

## 2014-01-04 NOTE — ED Notes (Signed)
Rt lateral abd pain for one week  No nv or diarrhea.  lmp   Last month

## 2014-01-05 ENCOUNTER — Emergency Department (HOSPITAL_COMMUNITY): Payer: Medicaid Other

## 2014-01-05 LAB — COMPREHENSIVE METABOLIC PANEL
ALBUMIN: 3.7 g/dL (ref 3.5–5.2)
ALT: 15 U/L (ref 0–35)
AST: 20 U/L (ref 0–37)
Alkaline Phosphatase: 67 U/L (ref 39–117)
Anion gap: 14 (ref 5–15)
BUN: 11 mg/dL (ref 6–23)
CO2: 24 mEq/L (ref 19–32)
Calcium: 9.1 mg/dL (ref 8.4–10.5)
Chloride: 105 mEq/L (ref 96–112)
Creatinine, Ser: 0.66 mg/dL (ref 0.50–1.10)
GFR calc Af Amer: >90 mL/min (ref >90)
GFR calc non Af Amer: >90 mL/min (ref >90)
Glucose, Bld: 103 mg/dL — ABNORMAL HIGH (ref 70–99)
POTASSIUM: 3.7 meq/L (ref 3.7–5.3)
Sodium: 143 mEq/L (ref 137–147)
TOTAL PROTEIN: 7.2 g/dL (ref 6.0–8.3)
Total Bilirubin: 0.3 mg/dL (ref 0.3–1.2)

## 2014-01-05 LAB — URINE MICROSCOPIC-ADD ON

## 2014-01-05 LAB — URINALYSIS, ROUTINE W REFLEX MICROSCOPIC
Bilirubin Urine: NEGATIVE
Glucose, UA: NEGATIVE mg/dL
Ketones, ur: NEGATIVE mg/dL
Nitrite: NEGATIVE
Protein, ur: >300 mg/dL — AB
SPECIFIC GRAVITY, URINE: 1.011 (ref 1.005–1.030)
UROBILINOGEN UA: 0.2 mg/dL (ref 0.0–1.0)
pH: 7.5 (ref 5.0–8.0)

## 2014-01-05 LAB — CBC WITH DIFFERENTIAL/PLATELET
BASOS PCT: 0 % (ref 0–1)
Basophils Absolute: 0 10*3/uL (ref 0.0–0.1)
EOS ABS: 0.2 10*3/uL (ref 0.0–0.7)
Eosinophils Relative: 2 % (ref 0–5)
HCT: 36 % (ref 36.0–46.0)
Hemoglobin: 12 g/dL (ref 12.0–15.0)
Lymphocytes Relative: 29 % (ref 12–46)
Lymphs Abs: 2.3 10*3/uL (ref 0.7–4.0)
MCH: 29.9 pg (ref 26.0–34.0)
MCHC: 33.3 g/dL (ref 30.0–36.0)
MCV: 89.6 fL (ref 78.0–100.0)
MONOS PCT: 7 % (ref 3–12)
Monocytes Absolute: 0.5 10*3/uL (ref 0.1–1.0)
Neutro Abs: 4.9 10*3/uL (ref 1.7–7.7)
Neutrophils Relative %: 62 % (ref 43–77)
Platelets: 330 10*3/uL (ref 150–400)
RBC: 4.02 MIL/uL (ref 3.87–5.11)
RDW: 12.8 % (ref 11.5–15.5)
WBC: 7.9 10*3/uL (ref 4.0–10.5)

## 2014-01-05 LAB — WET PREP, GENITAL
Trich, Wet Prep: NONE SEEN
Yeast Wet Prep HPF POC: NONE SEEN

## 2014-01-05 LAB — HIV ANTIBODY (ROUTINE TESTING W REFLEX): HIV: NONREACTIVE

## 2014-01-05 LAB — LIPASE, BLOOD: Lipase: 59 U/L (ref 11–59)

## 2014-01-05 LAB — RPR

## 2014-01-05 MED ORDER — AZITHROMYCIN 1 G PO PACK
1.0000 g | PACK | Freq: Once | ORAL | Status: AC
  Administered 2014-01-05: 1 g via ORAL
  Filled 2014-01-05: qty 1

## 2014-01-05 MED ORDER — DEXTROSE 5 % IV SOLN
1.0000 g | Freq: Once | INTRAVENOUS | Status: AC
  Administered 2014-01-05: 1 g via INTRAVENOUS
  Filled 2014-01-05: qty 10

## 2014-01-05 MED ORDER — DOXYCYCLINE HYCLATE 100 MG PO CAPS: 100.0000 mg | ORAL_CAPSULE | Freq: Two times a day (BID) | ORAL | Status: DC

## 2014-01-05 MED ORDER — METRONIDAZOLE 500 MG PO TABS: 500.0000 mg | ORAL_TABLET | Freq: Two times a day (BID) | ORAL | Status: DC

## 2014-01-05 MED ORDER — ONDANSETRON 8 MG PO TBDP: 8.0000 mg | ORAL_TABLET | Freq: Three times a day (TID) | ORAL | Status: DC | PRN

## 2014-01-05 MED ORDER — SODIUM CHLORIDE 0.9 % IV SOLN
Freq: Once | INTRAVENOUS | Status: AC
  Administered 2014-01-05: 01:00:00 via INTRAVENOUS

## 2014-01-05 MED ORDER — HYDROCODONE-ACETAMINOPHEN 5-325 MG PO TABS: 2.0000 | ORAL_TABLET | ORAL | Status: DC | PRN

## 2014-01-05 MED ORDER — FENTANYL CITRATE 0.05 MG/ML IJ SOLN
50.0000 ug | Freq: Once | INTRAMUSCULAR | Status: AC
  Administered 2014-01-05: 50 ug via INTRAVENOUS
  Filled 2014-01-05: qty 2

## 2014-01-05 NOTE — ED Provider Notes (Signed)
CSN: [id number]     Arrival date & time 01/04/14  2251 History   First MD Initiated Contact with Patient 01/04/14 2354     Chief Complaint  Patient presents with  . Abdominal Pain     (Consider location/radiation/quality/duration/timing/severity/associated sxs/prior Treatment) HPI 23 year old female presents to emergency department from home with complaint of right-sided abdominal pain.  She reports that she has had dull achy pain to the right side over the last week, but has been tolerable.  This evening the pain became severe sharp and unbearable.  She denies any fever or chills.  She denies any vaginal discharge.  No new sexual partners.  Patient denies any dysuria. Past Medical History  Diagnosis Date  . UTI (lower urinary tract infection)   . No pertinent past medical history    Past Surgical History  Procedure Laterality Date  . No past surgeries     No family history on file. History  Substance Use Topics  . Smoking status: Never Smoker   . Smokeless tobacco: Never Used  . Alcohol Use: No   OB History   Grav Para Term Preterm Abortions TAB SAB Ect Mult Living   4 2 2  1 1    2      Review of Systems   See History of Present Illness; otherwise all other systems are reviewed and negative  Allergies  Review of patient's allergies indicates no known allergies.  Home Medications   Prior to Admission medications   Not on File   BP 97/63  Pulse 85  Temp(Src) 98.2 F (36.8 C) (Oral)  Resp 18  SpO2 98%  LMP 07/03/2013  Breastfeeding? Unknown Physical Exam  Nursing note and vitals reviewed. Constitutional: She is oriented to person, place, and time. She appears well-developed and well-nourished.  HENT:  Head: Normocephalic and atraumatic.  Right Ear: External ear normal.  Left Ear: External ear normal.  Nose: Nose normal.  Mouth/Throat: Oropharynx is clear and moist.  Eyes: Conjunctivae and EOM are normal. Pupils are equal, round, and reactive to light.   Neck: Normal range of motion. Neck supple. No JVD present. No tracheal deviation present. No thyromegaly present.  Cardiovascular: Normal rate, regular rhythm, normal heart sounds and intact distal pulses.  Exam reveals no gallop and no friction rub.   No murmur heard. Pulmonary/Chest: Effort normal and breath sounds normal. No stridor. No respiratory distress. She has no wheezes. She has no rales. She exhibits no tenderness.  Abdominal: Soft. Bowel sounds are normal. She exhibits no distension and no mass. There is tenderness (patient has tenderness to palpation suprapubic and right lower quadrant). There is no rebound and no guarding.  Genitourinary:  External genitalia normal Vagina with scant white yellow discharge Cervix closed no lesions Patient has significant cervical motion tenderness Adnexa palpated, no masses or tenderness noted on left, however patient is exquisitely tender to palpation of the right adnexa Bladder palpated no tenderness Uterus palpated no masses or tenderness    Musculoskeletal: Normal range of motion. She exhibits no edema and no tenderness.  Lymphadenopathy:    She has no cervical adenopathy.  Neurological: She is alert and oriented to person, place, and time. She exhibits normal muscle tone. Coordination normal.  Skin: Skin is warm and dry. No rash noted. No erythema. No pallor.  Psychiatric: She has a normal mood and affect. Her behavior is normal. Judgment and thought content normal.    ED Course  Procedures (including critical care time) Labs Review Labs Reviewed  WET PREP, GENITAL - Abnormal; Notable for the following:    Clue Cells Wet Prep HPF POC FEW (*)    WBC, Wet Prep HPF POC FEW (*)    All other components within normal limits  COMPREHENSIVE METABOLIC PANEL - Abnormal; Notable for the following:    Glucose, Bld 103 (*)    All other components within normal limits  URINALYSIS, ROUTINE W REFLEX MICROSCOPIC - Abnormal; Notable for the  following:    APPearance TURBID (*)    Hgb urine dipstick LARGE (*)    Protein, ur >300 (*)    Leukocytes, UA LARGE (*)    All other components within normal limits  URINE MICROSCOPIC-ADD ON - Abnormal; Notable for the following:    Bacteria, UA FEW (*)    All other components within normal limits  GC/CHLAMYDIA PROBE AMP  CBC WITH DIFFERENTIAL  LIPASE, BLOOD  PREGNANCY, URINE  RPR  HIV ANTIBODY (ROUTINE TESTING)    Imaging Review US Transvaginal Non-ob  01/05/2014   CLINICAL DATA:  Right adnexal pain.  EXAM: TRANSABDOMINAL AND TRANSVAGINAL ULTRASOUND OF PELVIS  TECHNIQUE: Both transabdominal and transvaginal ultrasound examinations of the pelvis were performed. Transabdominal technique was performed for global imaging of the pelvis including uterus, ovaries, adnexal regions, and pelvic cul-de-sac. It was necessary to proceed with endovaginal exam following the transabdominal exam to visualize the uterus and ovaries in greater detail.  COMPARISON:  Pelvic ultrasound performed 09/04/2013  FINDINGS: Uterus  Measurements: 8.8 x 3.9 x 5.7 cm. No fibroids or other mass visualized.  Endometrium  Thickness: 1.5 cm, possibly reflecting the secretory phase of the cycle. No focal abnormality visualized.  Right ovary  Measurements: 3.5 x 1.9 x 3.3 cm. Mildly increased associated blood flow is noted at the right ovary.  Note is made of a tubular structure at the right adnexa, with relatively homogeneous internal echoes. Associated wall thickening is noted. This is suspicious for pyosalpinx. Associated focal tenderness is noted.  Left ovary  Measurements: 3.2 x 1.6 x 2.1 cm. Normal appearance/no adnexal mass.  Other findings  A small amount of free fluid is seen in the pelvic cul-de-sac, likely physiologic in nature.  IMPRESSION: 1. Suspect right-sided pyosalpinx, with focal tenderness and mild hyperemia about the right ovary. 2. Otherwise unremarkable pelvic ultrasound.   Electronically Signed   By: Roanna Raider M.D.   On: 01/05/2014 03:21   US Pelvis Complete  01/05/2014   CLINICAL DATA:  Right adnexal pain.  EXAM: TRANSABDOMINAL AND TRANSVAGINAL ULTRASOUND OF PELVIS  TECHNIQUE: Both transabdominal and transvaginal ultrasound examinations of the pelvis were performed. Transabdominal technique was performed for global imaging of the pelvis including uterus, ovaries, adnexal regions, and pelvic cul-de-sac. It was necessary to proceed with endovaginal exam following the transabdominal exam to visualize the uterus and ovaries in greater detail.  COMPARISON:  Pelvic ultrasound performed 09/04/2013  FINDINGS: Uterus  Measurements: 8.8 x 3.9 x 5.7 cm. No fibroids or other mass visualized.  Endometrium  Thickness: 1.5 cm, possibly reflecting the secretory phase of the cycle. No focal abnormality visualized.  Right ovary  Measurements: 3.5 x 1.9 x 3.3 cm. Mildly increased associated blood flow is noted at the right ovary.  Note is made of a tubular structure at the right adnexa, with relatively homogeneous internal echoes. Associated wall thickening is noted. This is suspicious for pyosalpinx. Associated focal tenderness is noted.  Left ovary  Measurements: 3.2 x 1.6 x 2.1 cm. Normal appearance/no adnexal mass.  Other findings  A  small amount of free fluid is seen in the pelvic cul-de-sac, likely physiologic in nature.  IMPRESSION: 1. Suspect right-sided pyosalpinx, with focal tenderness and mild hyperemia about the right ovary. 2. Otherwise unremarkable pelvic ultrasound.   Electronically Signed   By: Roanna RaiderJeffery  Chang M.D.   On: 01/05/2014 03:21     EKG Interpretation None      MDM   Final diagnoses:  Right pyosalpinx  PID (acute pelvic inflammatory disease)   23 year old female with right-sided abdominal pain.  Urine looks suspicious for urinary tract infection, however she is exquisitely tender in her right adnexa.  I am concerned about PID.  Plan for ultrasound to further delineate.  Ultrasound completed.   Patient has probable pyosalpinx on the right.  Patient is afebrile here, white blood cell count is normal and has had no nausea or vomiting.  Plan for treatment with doxycycline and Flagyl with close followup with GYN.  Patient has been given precautions for return.  Here in the ED she has been given Zithromax and Rocephin.  Olivia Mackielga M Patrecia Veiga, MD 01/05/14 [phone]

## 2014-01-05 NOTE — ED Notes (Signed)
MD at bedside. 

## 2014-01-05 NOTE — ED Notes (Signed)
Pt returned from US. no apparent distress

## 2014-01-05 NOTE — Discharge Instructions (Signed)
Take medications as prescribed.  Abstain from sex until your partners have been tested and treated.  Return to the emergency room for fevers, nausea vomiting, worsening pain or other concerning symptoms.   Pelvic Inflammatory Disease Pelvic inflammatory disease (PID) refers to an infection in some or all of the female organs. The infection can be in the uterus, ovaries, fallopian tubes, or the surrounding tissues in the pelvis. PID can cause abdominal or pelvic pain that comes on suddenly (acute pelvic pain). PID is a serious infection because it can lead to lasting (chronic) pelvic pain or the inability to have children (infertile).  CAUSES  The infection is often caused by the normal bacteria found in the vaginal tissues. PID may also be caused by an infection that is spread during sexual contact. PID can also occur following:   The birth of a baby.   A miscarriage.   An abortion.   Major pelvic surgery.   The use of an intrauterine device (IUD).   A sexual assault.  RISK FACTORS Certain factors can put a person at higher risk for PID, such as:  Being younger than 25 years.  Being sexually active at [place] age.  Usingnonbarrier contraception.  Havingmultiple sexual partners.  Having sex with someone who has symptoms of a genital infection.  Using oral contraception. Other times, certain behaviors can increase the possibility of getting PID, such as:  Having sex during your period.  Using a vaginal douche.  Having an intrauterine device (IUD) in place. SYMPTOMS   Abdominal or pelvic pain.   Fever.   Chills.   Abnormal vaginal discharge.  Abnormal uterine bleeding.   Unusual pain shortly after finishing your period. DIAGNOSIS  Your caregiver will choose some of the following methods to make a diagnosis, such as:   Performinga physical exam and history. A pelvic exam typically reveals a very tender uterus and surrounding pelvis.   Ordering  laboratory tests including a pregnancy test, blood tests, and urine test.  Orderingcultures of the vagina and cervix to check for a sexually transmitted infection (STI).  Performing an ultrasound.   Performing a laparoscopic procedure to look inside the pelvis.  TREATMENT   Antibiotic medicines may be prescribed and taken by mouth.   Sexual partners may be treated when the infection is caused by a sexually transmitted disease (STD).   Hospitalization may be needed to give antibiotics intravenously.  Surgery may be needed, but this is rare. It may take weeks until you are completely well. If you are diagnosed with PID, you should also be checked for human immunodeficiency virus (HIV). HOME CARE INSTRUCTIONS   If given, take your antibiotics as directed. Finish the medicine even if you start to feel better.   Only take over-the-counter or prescription medicines for pain, discomfort, or fever as directed by your caregiver.   Do not have sexual intercourse until treatment is completed or as directed by your caregiver. If PID is confirmed, your recent sexual partner(s) will need treatment.   Keep your follow-up appointments. SEEK MEDICAL CARE IF:   You have increased or abnormal vaginal discharge.   You need prescription medicine for your pain.   You vomit.   You cannot take your medicines.   Your partner has an STD.  SEEK IMMEDIATE MEDICAL CARE IF:   You have a fever.   You have increased abdominal or pelvic pain.   You have chills.   You have pain when you urinate.   You are not better  after 72 hours following treatment.  MAKE SURE YOU:   Understand these instructions.  Will watch your condition.  Will get help right away if you are not doing well or get worse. Document Released: 06/05/2005 Document Revised: 09/30/2012 Document Reviewed: 06/01/2011 [facility] Patient Information 2015 [place], [place]. This information is not intended to replace  advice given to you by your health care provider. Make sure you discuss any questions you have with your health care provider.

## 2014-01-06 LAB — GC/CHLAMYDIA PROBE AMP
CT Probe RNA: NEGATIVE
GC PROBE AMP APTIMA: NEGATIVE

## 2014-02-15 ENCOUNTER — Emergency Department (HOSPITAL_COMMUNITY)
Admission: EM | Admit: 2014-02-15 | Discharge: 2014-02-15 | Disposition: A | Discharge status: Home / Self Care | Payer: Medicaid Other | Attending: Emergency Medicine | Admitting: Emergency Medicine

## 2014-02-15 ENCOUNTER — Emergency Department (HOSPITAL_COMMUNITY): Payer: Medicaid Other

## 2014-02-15 ENCOUNTER — Encounter (HOSPITAL_COMMUNITY): Payer: Self-pay | Admitting: Emergency Medicine

## 2014-02-15 DIAGNOSIS — N949 Unspecified condition associated with female genital organs and menstrual cycle: Secondary | ICD-10-CM | POA: Insufficient documentation

## 2014-02-15 DIAGNOSIS — R000 Tachycardia, unspecified: Secondary | ICD-10-CM | POA: Diagnosis not present

## 2014-02-15 DIAGNOSIS — N390 Urinary tract infection, site not specified: Secondary | ICD-10-CM

## 2014-02-15 DIAGNOSIS — Z3202 Encounter for pregnancy test, result negative: Secondary | ICD-10-CM | POA: Insufficient documentation

## 2014-02-15 LAB — COMPREHENSIVE METABOLIC PANEL
ALT: 17 U/L (ref 0–35)
AST: 19 U/L (ref 0–37)
Albumin: 4.3 g/dL (ref 3.5–5.2)
Alkaline Phosphatase: 74 U/L (ref 39–117)
Anion gap: 15 (ref 5–15)
BUN: 9 mg/dL (ref 6–23)
CO2: 23 mEq/L (ref 19–32)
Calcium: 9.2 mg/dL (ref 8.4–10.5)
Chloride: 108 mEq/L (ref 96–112)
Creatinine, Ser: 0.64 mg/dL (ref 0.50–1.10)
GFR calc non Af Amer: >90 mL/min (ref >90)
Glucose, Bld: 93 mg/dL (ref 70–99)
POTASSIUM: 3.6 meq/L — AB (ref 3.7–5.3)
SODIUM: 146 meq/L (ref 137–147)
Total Bilirubin: 0.6 mg/dL (ref 0.3–1.2)
Total Protein: 8.3 g/dL (ref 6.0–8.3)

## 2014-02-15 LAB — CBC WITH DIFFERENTIAL/PLATELET
Basophils Absolute: 0 10*3/uL (ref 0.0–0.1)
Basophils Relative: 0 % (ref 0–1)
Eosinophils Absolute: 0.1 10*3/uL (ref 0.0–0.7)
Eosinophils Relative: 1 % (ref 0–5)
HCT: 39 % (ref 36.0–46.0)
Hemoglobin: 13.5 g/dL (ref 12.0–15.0)
LYMPHS PCT: 13 % (ref 12–46)
Lymphs Abs: 1.2 10*3/uL (ref 0.7–4.0)
MCH: 30.1 pg (ref 26.0–34.0)
MCHC: 34.6 g/dL (ref 30.0–36.0)
MCV: 87.1 fL (ref 78.0–100.0)
Monocytes Absolute: 0.7 10*3/uL (ref 0.1–1.0)
Monocytes Relative: 7 % (ref 3–12)
Neutro Abs: 7.3 10*3/uL (ref 1.7–7.7)
Neutrophils Relative %: 79 % — ABNORMAL HIGH (ref 43–77)
PLATELETS: 354 10*3/uL (ref 150–400)
RBC: 4.48 MIL/uL (ref 3.87–5.11)
RDW: 12.4 % (ref 11.5–15.5)
WBC: 9.4 10*3/uL (ref 4.0–10.5)

## 2014-02-15 LAB — URINALYSIS, ROUTINE W REFLEX MICROSCOPIC
BILIRUBIN URINE: NEGATIVE
Glucose, UA: NEGATIVE mg/dL
Ketones, ur: NEGATIVE mg/dL
Nitrite: NEGATIVE
Protein, ur: >300 mg/dL — AB
Specific Gravity, Urine: 1.017 (ref 1.005–1.030)
UROBILINOGEN UA: 0.2 mg/dL (ref 0.0–1.0)
pH: 7.5 (ref 5.0–8.0)

## 2014-02-15 LAB — WET PREP, GENITAL
Clue Cells Wet Prep HPF POC: NONE SEEN
Trich, Wet Prep: NONE SEEN
Yeast Wet Prep HPF POC: NONE SEEN

## 2014-02-15 LAB — URINE MICROSCOPIC-ADD ON

## 2014-02-15 LAB — POC URINE PREG, ED: Preg Test, Ur: NEGATIVE

## 2014-02-15 MED ORDER — DEXTROSE 5 % IV SOLN
1.0000 g | Freq: Once | INTRAVENOUS | Status: AC
  Administered 2014-02-15: 1 g via INTRAVENOUS
  Filled 2014-02-15: qty 10

## 2014-02-15 MED ORDER — OXYCODONE-ACETAMINOPHEN 5-325 MG PO TABS
1.0000 | ORAL_TABLET | Freq: Once | ORAL | Status: AC
  Administered 2014-02-15: 1 via ORAL
  Filled 2014-02-15: qty 1

## 2014-02-15 MED ORDER — IOHEXOL 300 MG/ML  SOLN
100.0000 mL | Freq: Once | INTRAMUSCULAR | Status: AC | PRN
  Administered 2014-02-15: 100 mL via INTRAVENOUS

## 2014-02-15 MED ORDER — MORPHINE SULFATE 4 MG/ML IJ SOLN
4.0000 mg | Freq: Once | INTRAMUSCULAR | Status: AC
  Administered 2014-02-15: 4 mg via INTRAVENOUS
  Filled 2014-02-15: qty 1

## 2014-02-15 MED ORDER — ONDANSETRON 4 MG PO TBDP
8.0000 mg | ORAL_TABLET | Freq: Once | ORAL | Status: AC
  Administered 2014-02-15: 8 mg via ORAL
  Filled 2014-02-15: qty 2

## 2014-02-15 MED ORDER — SULFAMETHOXAZOLE-TRIMETHOPRIM 800-160 MG PO TABS: 1.0000 | ORAL_TABLET | Freq: Two times a day (BID) | ORAL | Status: DC

## 2014-02-15 MED ORDER — OXYCODONE-ACETAMINOPHEN 5-325 MG PO TABS: 2.0000 | ORAL_TABLET | Freq: Four times a day (QID) | ORAL | Status: DC | PRN

## 2014-02-15 MED ORDER — SODIUM CHLORIDE 0.9 % IV BOLUS (SEPSIS)
1000.0000 mL | Freq: Once | INTRAVENOUS | Status: AC
  Administered 2014-02-15: 1000 mL via INTRAVENOUS

## 2014-02-15 NOTE — ED Notes (Signed)
Pt states she would like to receive results of CT exam alone; friend at bedside at this time

## 2014-02-15 NOTE — ED Notes (Addendum)
Pt reports being seen here last month for pelvic pain and had been diagnosed with PID. Took meds as prescribed and had return of severe pain this am with n/v. Denies vaginal discharge, itching or urinary symptoms. Also reports +etoh last night.

## 2014-02-15 NOTE — Discharge Instructions (Signed)
Pyelonephritis, Adult °Pyelonephritis is a kidney infection. In general, there are 2 main types of pyelonephritis: °· Infections that come on quickly without any warning (acute pyelonephritis). °· Infections that persist for a long period of time (chronic pyelonephritis). °CAUSES  °Two main causes of pyelonephritis are: °· Bacteria traveling from the bladder to the kidney. This is a problem especially in pregnant women. The urine in the bladder can become filled with bacteria from multiple causes, including: °¨ Inflammation of the prostate gland (prostatitis). °¨ Sexual intercourse in females. °¨ Bladder infection (cystitis). °· Bacteria traveling from the bloodstream to the tissue part of the kidney. °Problems that may increase your risk of getting a kidney infection include: °· Diabetes. °· Kidney stones or bladder stones. °· Cancer. °· Catheters placed in the bladder. °· Other abnormalities of the kidney or ureter. °SYMPTOMS  °· Abdominal pain. °· Pain in the side or flank area. °· Fever. °· Chills. °· Upset stomach. °· Blood in the urine (dark urine). °· Frequent urination. °· Strong or persistent urge to urinate. °· Burning or stinging when urinating. °DIAGNOSIS  °Your caregiver may diagnose your kidney infection based on your symptoms. A urine sample may also be taken. °TREATMENT  °In general, treatment depends on how severe the infection is.  °· If the infection is mild and caught early, your caregiver may treat you with oral antibiotics and send you home. °· If the infection is more severe, the bacteria may have gotten into the bloodstream. This will require intravenous (IV) antibiotics and a hospital stay. Symptoms may include: °¨ High fever. °¨ Severe flank pain. °¨ Shaking chills. °· Even after a hospital stay, your caregiver may require you to be on oral antibiotics for a period of time. °· Other treatments may be required depending upon the cause of the infection. °HOME CARE INSTRUCTIONS  °· Take your  antibiotics as directed. Finish them even if you start to feel better. °· Make an appointment to have your urine checked to make sure the infection is gone. °· Drink enough fluids to keep your urine clear or pale yellow. °· Take medicines for the bladder if you have urgency and frequency of urination as directed by your caregiver. °SEEK IMMEDIATE MEDICAL CARE IF:  °· You have a fever or persistent symptoms for more than 2-3 days. °· You have a fever and your symptoms suddenly get worse. °· You are unable to take your antibiotics or fluids. °· You develop shaking chills. °· You experience extreme weakness or fainting. °· There is no improvement after 2 days of treatment. °MAKE SURE YOU: °· Understand these instructions. °· Will watch your condition. °· Will get help right away if you are not doing well or get worse. °Document Released: 06/05/2005 Document Revised: 12/05/2011 Document Reviewed: 11/09/2010 °ExitCare® Patient Information ©2015 [employer]. This information is not intended to replace advice given to you by your health care provider. Make sure you discuss any questions you have with your health care provider. ° °

## 2014-02-15 NOTE — ED Provider Notes (Signed)
CSN: [id number]     Arrival date & time 02/15/14  1306 History   First MD Initiated Contact with Patient 02/15/14 1609     Chief Complaint  Patient presents with  . Pelvic Pain  . Abdominal Pain     (Consider location/radiation/quality/duration/timing/severity/associated sxs/prior Treatment) HPI Comments: Patient presents to the emergency department with chief complaint of lower abdominal pain. She states the pain started a couple days ago. She states that she has had this problem before, and was seen here last month, and treated for PID. She states this feels the same. She denies fevers, or chills, but does report associated nausea and vomiting this morning. She denies diarrhea, new vaginal discharge or dysuria. Denies having surgery on her abdomen. The pain is moderate. It is worsened with palpation.  The history is provided by the patient. No language interpreter was used.    Past Medical History  Diagnosis Date  . UTI (lower urinary tract infection)   . No pertinent past medical history    Past Surgical History  Procedure Laterality Date  . No past surgeries     History reviewed. No pertinent family history. History  Substance Use Topics  . Smoking status: Never Smoker   . Smokeless tobacco: Never Used  . Alcohol Use: No   OB History   Grav Para Term Preterm Abortions TAB SAB Ect Mult Living   Review of Systems  Constitutional: Negative for fever and chills.  Respiratory: Negative for shortness of breath.   Cardiovascular: Negative for chest pain.  Gastrointestinal: Positive for abdominal pain. Negative for nausea, vomiting, diarrhea and constipation.  Genitourinary: Negative for dysuria.  All other systems reviewed and are negative.     Allergies  Review of patient's allergies indicates no known allergies.  Home Medications   Prior to Admission medications   Medication Sig Start Date End Date Taking? Authorizing Provider  acetaminophen  (TYLENOL) 500 MG tablet Take 1,000 mg by mouth every 6 (six) hours as needed for mild pain.   Yes Historical Provider, MD   BP 120/81  Pulse 110  Temp(Src) 99.1 F (37.3 C) (Oral)  Resp 24  SpO2 100%  LMP 01/31/2014 Physical Exam  Nursing note and vitals reviewed. Constitutional: She is oriented to person, place, and time. She appears well-developed and well-nourished.  HENT:  Head: Normocephalic and atraumatic.  Eyes: Conjunctivae and EOM are normal. Pupils are equal, round, and reactive to light.  Neck: Normal range of motion. Neck supple.  Cardiovascular: Regular rhythm.  Exam reveals no gallop and no friction rub.   No murmur heard. Mildly tachycardic  Pulmonary/Chest: Effort normal and breath sounds normal. No respiratory distress. She has no wheezes. She has no rales. She exhibits no tenderness.  Abdominal: Soft. Bowel sounds are normal. She exhibits no distension and no mass. There is tenderness. There is no rebound and no guarding.  Bilateral lower abdominal tenderness more on the right than on the left, no upper abdominal tenderness, no Murphy's sign  Genitourinary:  Pelvic exam chaperoned by female ER tech, no right or left adnexal tenderness, no uterine tenderness, no vaginal discharge, scant amount of bleeding, no CMT or friability, no foreign body, no injury to the external genitalia, no other significant findings   Musculoskeletal: Normal range of motion. She exhibits no edema and no tenderness.  Neurological: She is alert and oriented to person, place, and time.  Skin: Skin is warm  and dry.  Psychiatric: She has a normal mood and affect. Her behavior is normal. Judgment and thought content normal.    ED Course  Procedures (including critical care time) Results for orders placed during the hospital encounter of 02/15/14  WET PREP, GENITAL      Result Value Ref Range   Yeast Wet Prep HPF POC NONE SEEN  NONE SEEN   Trich, Wet Prep NONE SEEN  NONE SEEN   Clue Cells  Wet Prep HPF POC NONE SEEN  NONE SEEN   WBC, Wet Prep HPF POC FEW (*) NONE SEEN  CBC WITH DIFFERENTIAL      Result Value Ref Range   WBC 9.4  4.0 - 10.5 K/uL   RBC 4.48  3.87 - 5.11 MIL/uL   Hemoglobin 13.5  12.0 - 15.0 g/dL   HCT 16.1  09.6 - 04.5 %   MCV 87.1  78.0 - 100.0 fL   MCH 30.1  26.0 - 34.0 pg   MCHC 34.6  30.0 - 36.0 g/dL   RDW 40.9  81.1 - 91.4 %   Platelets 354  150 - 400 K/uL   Neutrophils Relative % 79 (*) 43 - 77 %   Neutro Abs 7.3  1.7 - 7.7 K/uL   Lymphocytes Relative 13  12 - 46 %   Lymphs Abs 1.2  0.7 - 4.0 K/uL   Monocytes Relative 7  3 - 12 %   Monocytes Absolute 0.7  0.1 - 1.0 K/uL   Eosinophils Relative 1  0 - 5 %   Eosinophils Absolute 0.1  0.0 - 0.7 K/uL   Basophils Relative 0  0 - 1 %   Basophils Absolute 0.0  0.0 - 0.1 K/uL  COMPREHENSIVE METABOLIC PANEL      Result Value Ref Range   Sodium 146  137 - 147 mEq/L   Potassium 3.6 (*) 3.7 - 5.3 mEq/L   Chloride 108  96 - 112 mEq/L   CO2 23  19 - 32 mEq/L   Glucose, Bld 93  70 - 99 mg/dL   BUN 9  6 - 23 mg/dL   Creatinine, Ser 7.82  0.50 - 1.10 mg/dL   Calcium 9.2  8.4 - 95.6 mg/dL   Total Protein 8.3  6.0 - 8.3 g/dL   Albumin 4.3  3.5 - 5.2 g/dL   AST 19  0 - 37 U/L   ALT 17  0 - 35 U/L   Alkaline Phosphatase 74  39 - 117 U/L   Total Bilirubin 0.6  0.3 - 1.2 mg/dL   GFR calc non Af Amer >90  >90 mL/min   GFR calc Af Amer >90  >90 mL/min   Anion gap 15  5 - 15  URINALYSIS, ROUTINE W REFLEX MICROSCOPIC      Result Value Ref Range   Color, Urine YELLOW  YELLOW   APPearance TURBID (*) CLEAR   Specific Gravity, Urine 1.017  1.005 - 1.030   pH 7.5  5.0 - 8.0   Glucose, UA NEGATIVE  NEGATIVE mg/dL   Hgb urine dipstick LARGE (*) NEGATIVE   Bilirubin Urine NEGATIVE  NEGATIVE   Ketones, ur NEGATIVE  NEGATIVE mg/dL   Protein, ur >213 (*) NEGATIVE mg/dL   Urobilinogen, UA 0.2  0.0 - 1.0 mg/dL   Nitrite NEGATIVE  NEGATIVE   Leukocytes, UA LARGE (*) NEGATIVE  URINE MICROSCOPIC-ADD ON      Result  Value Ref Range   Squamous Epithelial / LPF RARE  RARE   WBC, UA TOO NUMEROUS TO COUNT  <3 WBC/hpf   RBC / HPF 21-50  <3 RBC/hpf   Bacteria, UA MANY (*) RARE  POC URINE PREG, ED      Result Value Ref Range   Preg Test, Ur NEGATIVE  NEGATIVE   Ct Abdomen Pelvis W Contrast  02/15/2014   CLINICAL DATA:  RLQ tenderness PELVIC PAIN ABDOMINAL PAIN  EXAM: CT ABDOMEN AND PELVIS WITH CONTRAST  TECHNIQUE: Multidetector CT imaging of the abdomen and pelvis was performed using the standard protocol following bolus administration of intravenous contrast.  CONTRAST:  100 mL Omnipaque 300 IV  COMPARISON:  None.  FINDINGS: Visualized lung bases clear. Unremarkable liver, nondilated gallbladder, spleen, adrenal glands, pancreas, abdominal aorta, portal vein, left kidney.  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 There is marked hydronephrosis of the superior moiety and ureterectasis seen nearly to the urinary bladder, within atypical deviation of the ureter towards the midline distally. There is associated parenchymal loss suspected in the upper pole right kidney.  Stomach, small bowel, and colon are nondilated. Normal appendix. No ascites. Uterus and adnexal regions unremarkable. No free air. No adenopathy. Regional bones unremarkable.  IMPRESSION: 1. 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 Associated parenchymal loss suggests this is a chronic process.   Electronically Signed   By: Oley Balm M.D.   On: 02/15/2014 20:46      EKG Interpretation None      MDM   Final diagnoses:  UTI (lower urinary tract infection)    Patient with RLQ pain x 3 weeks.  Recently treated for PID.  Will check labs and will reassess.  Doubt PID, wet prep is largely unremarkable, no vaginal discharge. Will order CT scan to rule out appendicitis.  CT his back, no evidence appendectomy, but findings  as above.  Discussed with Dr. Blinda Leatherwood, who recommends urology consult.  9:16 PM Patient discussed with Dr. Retta Diones, who recommends treatment with bactrim, and in the abscess of fever, DC to home with outpatient follow-up in his office.  Will treat patient's pain and recommend follow-up as directed.  Patient is stable and ready for discharge.    Roxy Horseman, PA-C 02/15/14 2204

## 2014-02-15 NOTE — ED Provider Notes (Signed)
Medical screening examination/treatment/procedure(s) were performed by non-physician practitioner and as supervising physician I was immediately available for consultation/collaboration.   EKG Interpretation None        Gilda Crease, MD 02/15/14 [phone]

## 2014-02-15 NOTE — ED Notes (Signed)
Notified CT that pt is done with contrast, Sarah in CT stated she would send a transporter to pick up pt.

## 2014-02-17 LAB — URINE CULTURE
COLONY COUNT: NO GROWTH
Culture: NO GROWTH

## 2014-02-17 LAB — GC/CHLAMYDIA PROBE AMP
CT PROBE, AMP APTIMA: NEGATIVE
GC Probe RNA: NEGATIVE

## 2014-04-20 ENCOUNTER — Encounter (HOSPITAL_COMMUNITY): Payer: Self-pay | Admitting: Emergency Medicine

## 2014-05-13 ENCOUNTER — Emergency Department (HOSPITAL_COMMUNITY)
Admission: EM | Admit: 2014-05-13 | Discharge: 2014-05-13 | Disposition: A | Discharge status: Home / Self Care | Payer: Medicaid Other | Attending: Emergency Medicine | Admitting: Emergency Medicine

## 2014-05-13 ENCOUNTER — Emergency Department (HOSPITAL_COMMUNITY): Payer: Medicaid Other

## 2014-05-13 ENCOUNTER — Encounter (HOSPITAL_COMMUNITY): Payer: Self-pay | Admitting: Emergency Medicine

## 2014-05-13 DIAGNOSIS — R112 Nausea with vomiting, unspecified: Secondary | ICD-10-CM | POA: Diagnosis not present

## 2014-05-13 DIAGNOSIS — Z331 Pregnant state, incidental: Secondary | ICD-10-CM | POA: Insufficient documentation

## 2014-05-13 DIAGNOSIS — Z3490 Encounter for supervision of normal pregnancy, unspecified, unspecified trimester: Secondary | ICD-10-CM

## 2014-05-13 DIAGNOSIS — R079 Chest pain, unspecified: Secondary | ICD-10-CM | POA: Insufficient documentation

## 2014-05-13 DIAGNOSIS — Z3202 Encounter for pregnancy test, result negative: Secondary | ICD-10-CM | POA: Insufficient documentation

## 2014-05-13 DIAGNOSIS — N390 Urinary tract infection, site not specified: Secondary | ICD-10-CM | POA: Diagnosis not present

## 2014-05-13 LAB — URINALYSIS, ROUTINE W REFLEX MICROSCOPIC
Bilirubin Urine: NEGATIVE
GLUCOSE, UA: NEGATIVE mg/dL
Ketones, ur: NEGATIVE mg/dL
NITRITE: NEGATIVE
Protein, ur: 30 mg/dL — AB
Specific Gravity, Urine: 1.006 (ref 1.005–1.030)
Urobilinogen, UA: 0.2 mg/dL (ref 0.0–1.0)
pH: 7.5 (ref 5.0–8.0)

## 2014-05-13 LAB — I-STAT TROPONIN, ED: Troponin i, poc: 0 ng/mL (ref 0.00–0.08)

## 2014-05-13 LAB — COMPREHENSIVE METABOLIC PANEL
ALT: 13 U/L (ref 0–35)
AST: 15 U/L (ref 0–37)
Albumin: 3.5 g/dL (ref 3.5–5.2)
Alkaline Phosphatase: 58 U/L (ref 39–117)
Anion gap: 11 (ref 5–15)
BILIRUBIN TOTAL: 0.3 mg/dL (ref 0.3–1.2)
BUN: 10 mg/dL (ref 6–23)
CHLORIDE: 105 meq/L (ref 96–112)
CO2: 24 mEq/L (ref 19–32)
Calcium: 9.2 mg/dL (ref 8.4–10.5)
Creatinine, Ser: 0.63 mg/dL (ref 0.50–1.10)
GFR calc Af Amer: >90 mL/min (ref >90)
GFR calc non Af Amer: >90 mL/min (ref >90)
Glucose, Bld: 79 mg/dL (ref 70–99)
POTASSIUM: 3.7 meq/L (ref 3.7–5.3)
SODIUM: 140 meq/L (ref 137–147)
Total Protein: 7.2 g/dL (ref 6.0–8.3)

## 2014-05-13 LAB — CBC WITH DIFFERENTIAL/PLATELET
BASOS PCT: 0 % (ref 0–1)
Basophils Absolute: 0 10*3/uL (ref 0.0–0.1)
Eosinophils Absolute: 0.1 10*3/uL (ref 0.0–0.7)
Eosinophils Relative: 2 % (ref 0–5)
HCT: 34.4 % — ABNORMAL LOW (ref 36.0–46.0)
Hemoglobin: 11.8 g/dL — ABNORMAL LOW (ref 12.0–15.0)
Lymphocytes Relative: 30 % (ref 12–46)
Lymphs Abs: 1.3 10*3/uL (ref 0.7–4.0)
MCH: 29.6 pg (ref 26.0–34.0)
MCHC: 34.3 g/dL (ref 30.0–36.0)
MCV: 86.2 fL (ref 78.0–100.0)
MONOS PCT: 10 % (ref 3–12)
Monocytes Absolute: 0.5 10*3/uL (ref 0.1–1.0)
NEUTROS PCT: 58 % (ref 43–77)
Neutro Abs: 2.6 10*3/uL (ref 1.7–7.7)
PLATELETS: 346 10*3/uL (ref 150–400)
RBC: 3.99 MIL/uL (ref 3.87–5.11)
RDW: 12.8 % (ref 11.5–15.5)
WBC: 4.5 10*3/uL (ref 4.0–10.5)

## 2014-05-13 LAB — URINE MICROSCOPIC-ADD ON

## 2014-05-13 LAB — LIPASE, BLOOD: Lipase: 41 U/L (ref 11–59)

## 2014-05-13 LAB — POC URINE PREG, ED: PREG TEST UR: POSITIVE — AB

## 2014-05-13 MED ORDER — CEPHALEXIN 500 MG PO CAPS: 500.0000 mg | ORAL_CAPSULE | Freq: Four times a day (QID) | ORAL | Status: DC

## 2014-05-13 MED ORDER — PROMETHAZINE HCL 25 MG RE SUPP: 25.0000 mg | Freq: Four times a day (QID) | RECTAL | Status: DC | PRN

## 2014-05-13 MED ORDER — KETOROLAC TROMETHAMINE 60 MG/2ML IM SOLN
60.0000 mg | Freq: Once | INTRAMUSCULAR | Status: AC
  Administered 2014-05-13: 60 mg via INTRAMUSCULAR
  Filled 2014-05-13: qty 2

## 2014-05-13 MED ORDER — METOCLOPRAMIDE HCL 10 MG PO TABS: 10.0000 mg | ORAL_TABLET | Freq: Three times a day (TID) | ORAL | Status: DC | PRN

## 2014-05-13 NOTE — ED Notes (Signed)
Pt states that she has been having central CP x 5 months.  Has not seen a doctor because of "her work schedule".

## 2014-05-13 NOTE — ED Notes (Signed)
Pt was positive for POC pregnancy advised PA Lorelle FormosaHanna and nurse of results

## 2014-05-13 NOTE — Discharge Instructions (Signed)
Chest Pain (Nonspecific) It is often hard to give a diagnosis for the cause of chest pain. There is always a chance that your pain could be related to something serious, such as a heart attack or a blood clot in the lungs. You need to follow up with your doctor. HOME CARE  If antibiotic medicine was given, take it as directed by your doctor. Finish the medicine even if you start to feel better.  For the next few days, avoid activities that bring on chest pain. Continue physical activities as told by your doctor.  Do not use any tobacco products. This includes cigarettes, chewing tobacco, and e-cigarettes.  Avoid drinking alcohol.  Only take medicine as told by your doctor.  Follow your doctor's suggestions for more testing if your chest pain does not go away.  Keep all doctor visits you made. GET HELP IF:  Your chest pain does not go away, even after treatment.  You have a rash with blisters on your chest.  You have a fever. GET HELP RIGHT AWAY IF:   You have more pain or pain that spreads to your arm, neck, jaw, back, or belly (abdomen).  You have shortness of breath.  You cough more than usual or cough up blood.  You have very bad back or belly pain.  You feel sick to your stomach (nauseous) or throw up (vomit).  You have very bad weakness.  You pass out (faint).  You have chills. This is an emergency. Do not wait to see if the problems will go away. Call your local emergency services (911 in [place] Do not drive yourself to the hospital. MAKE SURE YOU:   Understand these instructions.  Will watch your condition.  Will get help right away if you are not doing well or get worse. Document Released: 11/22/2007 Document Revised: 06/10/2013 Document Reviewed: 11/22/2007 [facility] Patient Information 2015 [place], [place]. This information is not intended to replace advice given to you by your health care provider. Make sure you discuss any questions you have with your  health care provider.  First Trimester of Pregnancy The first trimester of pregnancy is from week 1 until the end of week 12 (months 1 through 3). During this time, your baby will begin to develop inside you. At 6-8 weeks, the eyes and face are formed, and the heartbeat can be seen on ultrasound. At the end of 12 weeks, all the baby's organs are formed. Prenatal care is all the medical care you receive before the birth of your baby. Make sure you get good prenatal care and follow all of your doctor's instructions. HOME CARE  Medicines  Take medicine only as told by your doctor. Some medicines are safe and some are not during pregnancy.  Take your prenatal vitamins as told by your doctor.  Take medicine that helps you poop (stool softener) as needed if your doctor says it is okay. Diet  Eat regular, healthy meals.  Your doctor will tell you the amount of weight gain that is right for you.  Avoid raw meat and uncooked cheese.  If you feel sick to your stomach (nauseous) or throw up (vomit):  Eat 4 or 5 small meals a day instead of 3 large meals.  Try eating a few soda crackers.  Drink liquids between meals instead of during meals.  If you have a hard time pooping (constipation):  Eat high-fiber foods like fresh vegetables, fruit, and whole grains.  Drink enough fluids to keep your pee (urine)  clear or pale yellow. Activity and Exercise  Exercise only as told by your doctor. Stop exercising if you have cramps or pain in your lower belly (abdomen) or low back.  Try to avoid standing for long periods of time. Move your legs often if you must stand in one place for a long time.  Avoid heavy lifting.  Wear low-heeled shoes. Sit and stand up straight.  You can have sex unless your doctor tells you not to. Relief of Pain or Discomfort  Wear a good support bra if your breasts are sore.  Take warm water baths (sitz baths) to soothe pain or discomfort caused by hemorrhoids. Use  hemorrhoid cream if your doctor says it is okay.  Rest with your legs raised if you have leg cramps or low back pain.  Wear support hose if you have puffy, bulging veins (varicose veins) in your legs. Raise (elevate) your feet for 15 minutes, 3-4 times a day. Limit salt in your diet. Prenatal Care  Schedule your prenatal visits by the twelfth week of pregnancy.  Write down your questions. Take them to your prenatal visits.  Keep all your prenatal visits as told by your doctor. Safety  Wear your seat belt at all times when driving.  Make a list of emergency phone numbers. The list should include numbers for family, friends, the hospital, and police and fire departments. General Tips  Ask your doctor for a referral to a local prenatal class. Begin classes no later than at the start of month 6 of your pregnancy.  Ask for help if you need counseling or help with nutrition. Your doctor can give you advice or tell you where to go for help.  Do not use hot tubs, steam rooms, or saunas.  Do not douche or use tampons or scented sanitary pads.  Do not cross your legs for long periods of time.  Avoid litter boxes and soil used by cats.  Avoid all smoking, herbs, and alcohol. Avoid drugs not approved by your doctor.  Visit your dentist. At home, brush your teeth with a soft toothbrush. Be gentle when you floss. GET HELP IF:  You are dizzy.  You have mild cramps or pressure in your lower belly.  You have a nagging pain in your belly area.  You continue to feel sick to your stomach, throw up, or have watery poop (diarrhea).  You have a bad smelling fluid coming from your vagina.  You have pain with peeing (urination).  You have increased puffiness (swelling) in your face, hands, legs, or ankles. GET HELP RIGHT AWAY IF:   You have a fever.  You are leaking fluid from your vagina.  You have spotting or bleeding from your vagina.  You have very bad belly cramping or  pain.  You gain or lose weight rapidly.  You throw up blood. It may look like coffee grounds.  You are around people who have [place] measles, fifth disease, or chickenpox.  You have a very bad headache.  You have shortness of breath.  You have any kind of trauma, such as from a fall or a car accident. Document Released: 11/22/2007 Document Revised: 10/20/2013 Document Reviewed: 04/15/2013 [facility] Patient Information 2015 [place], [place]. This information is not intended to replace advice given to you by your health care provider. Make sure you discuss any questions you have with your health care provider.  Urinary Tract Infection A urinary tract infection (UTI) can occur any place along the urinary tract. The  tract includes the kidneys, ureters, bladder, and urethra. A type of germ called bacteria often causes a UTI. UTIs are often helped with antibiotic medicine.  HOME CARE   If given, take antibiotics as told by your doctor. Finish them even if you start to feel better.  Drink enough fluids to keep your pee (urine) clear or pale yellow.  Avoid tea, drinks with caffeine, and bubbly (carbonated) drinks.  Pee often. Avoid holding your pee in for a long time.  Pee before and after having sex (intercourse).  Wipe from front to back after you poop (bowel movement) if you are a woman. Use each tissue only once. GET HELP RIGHT AWAY IF:   You have back pain.  You have lower belly (abdominal) pain.  You have chills.  You feel sick to your stomach (nauseous).  You throw up (vomit).  Your burning or discomfort with peeing does not go away.  You have a fever.  Your symptoms are not better in 3 days. MAKE SURE YOU:   Understand these instructions.  Will watch your condition.  Will get help right away if you are not doing well or get worse. Document Released: 11/22/2007 Document Revised: 02/28/2012 Document Reviewed: 01/04/2012 [facility] Patient Information 2015 [place],  [place]. This information is not intended to replace advice given to you by your health care provider. Make sure you discuss any questions you have with your health care provider.

## 2014-05-13 NOTE — ED Provider Notes (Signed)
CSN: [id number]     Arrival date & time 05/13/14  1253 History   First MD Initiated Contact with Patient 05/13/14 1310     Chief Complaint  Patient presents with  . Chest Pain     (Consider location/radiation/quality/duration/timing/severity/associated sxs/prior Treatment) HPI Comments: Patient is a 23 year old female with history of UTI who presents to emergency department today for evaluation of chest pain. She reports that her chest pain has been ongoing for the past 5 months. It acutely worsened in the past 2 months. It is a sharp pain in the center of her chest worse with movement. Breathing, coughing, laughing worsen her pain. She does not feel short of breath. She is not taken anything to improve her pain. No long trips, recent surgeries, leg swelling. No prior history of DVT or PE. Patient is not currently on birth control or any exogenous estrogen. She reports that over the past few weeks she has began having vomiting. She denies any abdominal pain. She denies vaginal bleeding, vaginal discharge, pelvic pain, dysuria, urinary frequency, urinary urgency, but states that she has problems with recurrent UTI. LMP 2 months ago.   Patient is a 23 y.o. female presenting with chest pain. The history is provided by the patient. No language interpreter was used.  Chest Pain Associated symptoms: nausea and vomiting   Associated symptoms: no abdominal pain, no fever and no shortness of breath     Past Medical History  Diagnosis Date  . UTI (lower urinary tract infection)   . No pertinent past medical history    Past Surgical History  Procedure Laterality Date  . No past surgeries     No family history on file. History  Substance Use Topics  . Smoking status: Never Smoker   . Smokeless tobacco: Never Used  . Alcohol Use: No   OB History    Gravida Para Term Preterm AB TAB SAB Ectopic Multiple Living   4 2 2  1 1    2      Review of Systems  Constitutional: Negative for fever and  chills.  Respiratory: Negative for shortness of breath.   Cardiovascular: Positive for chest pain.  Gastrointestinal: Positive for nausea and vomiting. Negative for abdominal pain.  Genitourinary: Negative for dysuria, flank pain, vaginal bleeding, vaginal discharge, difficulty urinating, vaginal pain and pelvic pain.  All other systems reviewed and are negative.     Allergies  Review of patient's allergies indicates no known allergies.  Home Medications   Prior to Admission medications   Medication Sig Start Date End Date Taking? Authorizing Provider  acetaminophen (TYLENOL) 500 MG tablet Take 1,000 mg by mouth every 6 (six) hours as needed for mild pain.   Yes Historical Provider, MD  oxyCODONE-acetaminophen (PERCOCET/ROXICET) 5-325 MG per tablet Take 2 tablets by mouth every 6 (six) hours as needed for severe pain. Patient not taking: Reported on 05/13/2014 02/15/14   Roxy Horseman, PA-C  sulfamethoxazole-trimethoprim (SEPTRA DS) 800-160 MG per tablet Take 1 tablet by mouth every 12 (twelve) hours. Patient not taking: Reported on 05/13/2014 02/15/14   Roxy Horseman, PA-C   BP 118/70 mmHg  Pulse 74  Temp(Src) 98 F (36.7 C) (Oral)  Resp 19  SpO2 97%  LMP 03/28/2014 Physical Exam  Constitutional: She is oriented to person, place, and time. She appears well-developed and well-nourished. No distress.  HENT:  Head: Normocephalic and atraumatic.  Right Ear: External ear normal.  Left Ear: External ear normal.  Nose: Nose normal.  Mouth/Throat: Oropharynx is  clear and moist.  Eyes: Conjunctivae are normal.  Neck: Normal range of motion.  Cardiovascular: Normal rate, regular rhythm, normal heart sounds, intact distal pulses and normal pulses.   Pulses:      Radial pulses are 2+ on the right side, and 2+ on the left side.       Posterior tibial pulses are 2+ on the right side, and 2+ on the left side.  No leg swelling or tenderness  Pulmonary/Chest: Effort normal and breath  sounds normal. No stridor. No respiratory distress. She has no wheezes. She has no rales. She exhibits tenderness.    Abdominal: Soft. She exhibits no distension. There is no tenderness. There is no rigidity, no rebound and no guarding.  Musculoskeletal: Normal range of motion.  Neurological: She is alert and oriented to person, place, and time. She has normal strength.  Skin: Skin is warm and dry. She is not diaphoretic. No erythema.  Psychiatric: She has a normal mood and affect. Her behavior is normal.  Nursing note and vitals reviewed.   ED Course  Procedures (including critical care time) Labs Review Labs Reviewed  CBC WITH DIFFERENTIAL - Abnormal; Notable for the following:    Hemoglobin 11.8 (*)    HCT 34.4 (*)    All other components within normal limits  URINALYSIS, ROUTINE W REFLEX MICROSCOPIC - Abnormal; Notable for the following:    APPearance CLOUDY (*)    Hgb urine dipstick MODERATE (*)    Protein, ur 30 (*)    Leukocytes, UA LARGE (*)    All other components within normal limits  POC URINE PREG, ED - Abnormal; Notable for the following:    Preg Test, Ur POSITIVE (*)    All other components within normal limits  COMPREHENSIVE METABOLIC PANEL  LIPASE, BLOOD  URINE MICROSCOPIC-ADD ON  Rosezena SensorI-STAT TROPOININ, ED    Imaging Review Dg Chest 2 View  05/13/2014   CLINICAL DATA:  Central chest pain for 5 months.  Initial encounter.  EXAM: CHEST  2 VIEW  COMPARISON:  None.  FINDINGS: Cardiopericardial silhouette within normal limits. Mediastinal contours normal. Trachea midline. No airspace disease or effusion. Monitoring leads project over the chest.  IMPRESSION: No active cardiopulmonary disease.   Electronically Signed   By: Andreas NewportGeoffrey  Lamke M.D.   On: 05/13/2014 14:38     EKG Interpretation   Date/Time:  Wednesday May 13 2014 13:02:43 EST Ventricular Rate:  76 PR Interval:  109 QRS Duration: 73 QT Interval:  350 QTC Calculation: 393 R Axis:   49 Text  Interpretation:  Sinus arrhythmia Short PR interval Borderline  repolarization abnormality , new since last tracing Baseline wander in  lead(s) II III aVF V6 Confirmed by KNAPP  MD-J, JON ([id number]) on 05/13/2014  1:37:29 PM      MDM   Final diagnoses:  Chest pain  Pregnancy  UTI (lower urinary tract infection)    Patient presents emergency department for evaluation of chest pain x 5 months. Chest pain is reproducible to palpation. Labs and EKG are unremarkable. Chest x-ray shows no active cardiopulmonary disease. Patient was found to be pregnant. This is a new diagnosis for the patient. I do not believe that this is related to her chest pain as LMP was 2 months ago and chest pain began 5 months ago. Clinically I do not believe patient has PE. She is tender to palpation, worse with movement, no SOB, no hypoxia, no tachycardia. She denies any vaginal bleeding, vaginal discharge, pain, other pelvic symptoms.  She has a OB physician where she can follow up. No other workup for this is required at this time. Patient also found to have urinary tract infection. Will treat with Keflex. Discussed reasons to return to ED immediately with patient who expresses understanding. Vital signs stable for discharge. Discussed case with Dr. Donnald GarrePfeiffer who agrees with plan. Patient / Family / Caregiver informed of clinical course, understand medical decision-making process, and agree with plan.    Mora BellmanHannah S Uchenna Rappaport, PA-C 05/13/14 1611  Arby BarretteMarcy Pfeiffer, MD 05/14/14 [phone]

## 2014-06-09 ENCOUNTER — Inpatient Hospital Stay (HOSPITAL_COMMUNITY)
Admission: AD | Admit: 2014-06-09 | Discharge: 2014-06-09 | Disposition: A | Discharge status: Home / Self Care | Payer: Medicaid Other | Source: Ambulatory Visit | Attending: Family Medicine | Admitting: Family Medicine

## 2014-06-09 ENCOUNTER — Encounter (HOSPITAL_COMMUNITY): Payer: Self-pay | Admitting: *Deleted

## 2014-06-09 DIAGNOSIS — R109 Unspecified abdominal pain: Secondary | ICD-10-CM

## 2014-06-09 DIAGNOSIS — O9989 Other specified diseases and conditions complicating pregnancy, childbirth and the puerperium: Secondary | ICD-10-CM | POA: Insufficient documentation

## 2014-06-09 DIAGNOSIS — K5900 Constipation, unspecified: Secondary | ICD-10-CM | POA: Insufficient documentation

## 2014-06-09 DIAGNOSIS — O2341 Unspecified infection of urinary tract in pregnancy, first trimester: Secondary | ICD-10-CM | POA: Diagnosis not present

## 2014-06-09 DIAGNOSIS — R1032 Left lower quadrant pain: Secondary | ICD-10-CM | POA: Diagnosis present

## 2014-06-09 DIAGNOSIS — K5901 Slow transit constipation: Secondary | ICD-10-CM

## 2014-06-09 DIAGNOSIS — Z3A10 10 weeks gestation of pregnancy: Secondary | ICD-10-CM | POA: Diagnosis not present

## 2014-06-09 DIAGNOSIS — O26899 Other specified pregnancy related conditions, unspecified trimester: Secondary | ICD-10-CM

## 2014-06-09 HISTORY — DX: Major depressive disorder, single episode, unspecified: F32.9

## 2014-06-09 HISTORY — DX: Anxiety disorder, unspecified: F41.9

## 2014-06-09 HISTORY — DX: Depression, unspecified: F32.A

## 2014-06-09 LAB — WET PREP, GENITAL
Clue Cells Wet Prep HPF POC: NONE SEEN
TRICH WET PREP: NONE SEEN
Yeast Wet Prep HPF POC: NONE SEEN

## 2014-06-09 LAB — CBC
HCT: 33.7 % — ABNORMAL LOW (ref 36.0–46.0)
Hemoglobin: 11.8 g/dL — ABNORMAL LOW (ref 12.0–15.0)
MCH: 29.7 pg (ref 26.0–34.0)
MCHC: 35 g/dL (ref 30.0–36.0)
MCV: 84.9 fL (ref 78.0–100.0)
PLATELETS: 282 10*3/uL (ref 150–400)
RBC: 3.97 MIL/uL (ref 3.87–5.11)
RDW: 12.9 % (ref 11.5–15.5)
WBC: 6.4 10*3/uL (ref 4.0–10.5)

## 2014-06-09 LAB — URINALYSIS, ROUTINE W REFLEX MICROSCOPIC
Bilirubin Urine: NEGATIVE
GLUCOSE, UA: NEGATIVE mg/dL
KETONES UR: NEGATIVE mg/dL
Nitrite: NEGATIVE
PROTEIN: NEGATIVE mg/dL
Specific Gravity, Urine: <1.005 — ABNORMAL LOW (ref 1.005–1.030)
Urobilinogen, UA: 0.2 mg/dL (ref 0.0–1.0)
pH: 6.5 (ref 5.0–8.0)

## 2014-06-09 LAB — URINE MICROSCOPIC-ADD ON

## 2014-06-09 NOTE — MAU Note (Addendum)
PT  SAYS SHE HAS LEFT  SIDE  BACK PAIN-  WHEN  SHE WALKS -  STARTED   SAT-   TOOK TYLENOL  SAT-   650MG    .     WENT  TO [facility]  2-3 WEEKS AGO-  HAD PREG  CONFIRMED    SAYS    STOMACH    ALWAYS   HAS HURT-  NO VOMITING-    FEELS  NAUSEA   NOW   SAYS ALSO   FEELS  CONSTIPATED-   LAST  BM---WED-  Magdalene MollyHURS

## 2014-06-09 NOTE — MAU Provider Note (Signed)
History     CSN: [id number]  Arrival date and time: 06/09/14 2149   First Provider Initiated Contact with Patient 06/09/14 2240      No chief complaint on file.  HPI This is a 23 y.o. female at 4477w3d who presents with c/o LLQ and left back pain.  States has had no BM in 5 days or so. Denies bleeding. Has not started prenatal care yet. Currently being treated with Keflex for UTI  RN Note:  Expand All Collapse All   PT SAYS SHE HAS LEFT SIDE BACK PAIN- WHEN SHE WALKS - STARTED SAT- TOOK TYLENOL SAT- 650MG  . WENT TO [facility] 2-3 WEEKS AGO- HAD PREG CONFIRMED SAYS STOMACH ALWAYS HAS HURT- NO VOMITING- FEELS NAUSEA NOW SAYS ALSO FEELS CONSTIPATED- LAST BM---WED- THURS          OB History    Gravida Para Term Preterm AB TAB SAB Ectopic Multiple Living   5 2 2  2 1 1   2       Past Medical History  Diagnosis Date  . UTI (lower urinary tract infection)   . No pertinent past medical history   . Depression   . Anxiety     Past Surgical History  Procedure Laterality Date  . No past surgeries      No family history on file.  History  Substance Use Topics  . Smoking status: Never Smoker   . Smokeless tobacco: Never Used  . Alcohol Use: No    Allergies: No Known Allergies  Prescriptions prior to admission  Medication Sig Dispense Refill Last Dose  . acetaminophen (TYLENOL) 500 MG tablet Take 1,000 mg by mouth every 6 (six) hours as needed for mild pain.   unknown  . cephALEXin (KEFLEX) 500 MG capsule Take 1 capsule (500 mg total) by mouth 4 (four) times daily. 20 capsule 0   . metoCLOPramide (REGLAN) 10 MG tablet Take 1 tablet (10 mg total) by mouth 3 (three) times daily as needed for nausea (headache / nausea). 20 tablet 0   . oxyCODONE-acetaminophen (PERCOCET/ROXICET) 5-325 MG per tablet Take 2 tablets by mouth every 6 (six) hours as needed for severe pain. (Patient not taking: Reported on 05/13/2014) 15 tablet 0   .  sulfamethoxazole-trimethoprim (SEPTRA DS) 800-160 MG per tablet Take 1 tablet by mouth every 12 (twelve) hours. (Patient not taking: Reported on 05/13/2014) 20 tablet 0     Review of Systems  Constitutional: Negative for fever, chills and malaise/fatigue.  Gastrointestinal: Positive for nausea, abdominal pain and constipation. Negative for vomiting and diarrhea.  Neurological: Negative for headaches.   Physical Exam   Blood pressure 106/66, pulse 74, temperature 98.7 F (37.1 C), temperature source Oral, resp. rate 20, height 5\' 4"  (1.626 m), weight 159 lb 4 oz (72.235 kg), last menstrual period 03/28/2014, unknown if currently breastfeeding.  Physical Exam  Constitutional: She is oriented to person, place, and time. She appears well-developed and well-nourished. No distress.  HENT:  Head: Normocephalic.  Cardiovascular: Normal rate.   Respiratory: Effort normal.  GI: Soft. She exhibits no distension and no mass. There is tenderness (slight on left lower quadrant). There is no rebound and no guarding.  Musculoskeletal: Normal range of motion.  Neurological: She is alert and oriented to person, place, and time.  Skin: Skin is warm and dry.  Psychiatric: She has a normal mood and affect.    MAU Course  Procedures  MDM Bedside US done.  Single fetus, about 10 wks size, Heart Rate  150, Normal amniotic sac, Cultures done. Results for orders placed or performed during the hospital encounter of 06/09/14 (from the past 24 hour(s))  Urinalysis, Routine w reflex microscopic     Status: Abnormal   Collection Time: 06/09/14 10:14 PM  Result Value Ref Range   Color, Urine YELLOW YELLOW   APPearance CLOUDY (A) CLEAR   Specific Gravity, Urine <1.005 (L) 1.005 - 1.030   pH 6.5 5.0 - 8.0   Glucose, UA NEGATIVE NEGATIVE mg/dL   Hgb urine dipstick SMALL (A) NEGATIVE   Bilirubin Urine NEGATIVE NEGATIVE   Ketones, ur NEGATIVE NEGATIVE mg/dL   Protein, ur NEGATIVE NEGATIVE mg/dL    Urobilinogen, UA 0.2 0.0 - 1.0 mg/dL   Nitrite NEGATIVE NEGATIVE   Leukocytes, UA LARGE (A) NEGATIVE  Urine microscopic-add on     Status: Abnormal   Collection Time: 06/09/14 10:14 PM  Result Value Ref Range   Squamous Epithelial / LPF MANY (A) RARE   WBC, UA 11-20 <3 WBC/hpf   RBC / HPF 0-2 <3 RBC/hpf   Bacteria, UA FEW (A) RARE  CBC     Status: Abnormal   Collection Time: 06/09/14 10:45 PM  Result Value Ref Range   WBC 6.4 4.0 - 10.5 K/uL   RBC 3.97 3.87 - 5.11 MIL/uL   Hemoglobin 11.8 (L) 12.0 - 15.0 g/dL   HCT 16.133.7 (L) 09.636.0 - 04.546.0 %   MCV 84.9 78.0 - 100.0 fL   MCH 29.7 26.0 - 34.0 pg   MCHC 35.0 30.0 - 36.0 g/dL   RDW 40.912.9 81.111.5 - 91.415.5 %   Platelets 282 150 - 400 K/uL  Wet prep, genital     Status: Abnormal   Collection Time: 06/09/14 10:56 PM  Result Value Ref Range   Yeast Wet Prep HPF POC NONE SEEN NONE SEEN   Trich, Wet Prep NONE SEEN NONE SEEN   Clue Cells Wet Prep HPF POC NONE SEEN NONE SEEN   WBC, Wet Prep HPF POC FEW (A) NONE SEEN     Assessment and Plan  A:  SIUP at 5220w4d       Constipation      UTI, in treatment  P:  Discharge home       List of OB providers given       Continue antibiotic        Miralax for constipation then nightly Fiber laxative preventative  [facility] HospitalWILLIAMS,MARIE 06/09/2014, 11:01 PM

## 2014-06-09 NOTE — Discharge Instructions (Signed)
[facility]    [facility]  [phone]     Phone: [phone]          [facility]                      [facility]  @[email or web address]     Phone: [phone]  Phone: [phone]         [facility]     Phone: [phone]  Phone: [phone]           [facility] [place]                hone: [phone]  Phone: [phone]         [facility] Dr[facility] Francoise CeoBernard Marshall      Phone: [phone]  Phone: [phone]         [facility]                Phone: [phone]  [facility]   [phone]    [facility] [address]          Phone: [phone] [facility]   Phone: [phone]  Constipation Constipation is when a person has fewer than three bowel movements a week, has difficulty having a bowel movement, or has stools that are dry, hard, or larger than normal. As people grow older, constipation is more common. If you try to fix constipation with medicines that make you have a bowel movement (laxatives), the problem may get worse. Long-term laxative use may cause the muscles of the colon to become weak. A low-fiber diet, not taking in enough fluids, and taking certain medicines may make constipation worse.  CAUSES   Certain medicines, such as antidepressants, pain medicine, iron supplements, antacids, and water pills.   Certain diseases, such as diabetes, irritable bowel syndrome (IBS), thyroid disease, or depression.   Not drinking enough water.   Not eating enough fiber-rich foods.   Stress or travel.   Lack of physical activity or exercise.   Ignoring the urge to have a bowel movement.   Using laxatives too much.  SIGNS AND SYMPTOMS   Having fewer than three bowel movements a week.   Straining to have a bowel movement.   Having  stools that are hard, dry, or larger than normal.   Feeling full or bloated.   Pain in the lower abdomen.   Not feeling relief after having a bowel movement.  DIAGNOSIS  Your health care provider will take a medical history and perform a physical exam. Further testing may be done for severe constipation. Some tests may include:  A barium enema X-ray to examine your rectum, colon, and, sometimes, your small intestine.   A sigmoidoscopy to examine your lower colon.   A colonoscopy to examine your entire colon. TREATMENT  Treatment will depend on the severity of your constipation and what is causing it. Some dietary treatments include drinking more fluids and eating more fiber-rich foods. Lifestyle treatments may include regular exercise. If these diet and lifestyle recommendations do not help, your health care provider may recommend taking over-the-counter laxative medicines to help you have bowel movements. Prescription medicines may be prescribed if over-the-counter medicines do not work.  HOME CARE INSTRUCTIONS   Eat foods that have a lot of fiber, such as fruits, vegetables, whole grains,  and beans.  Limit foods high in fat and processed sugars, such as french fries, hamburgers, cookies, candies, and soda.   A fiber supplement may be added to your diet if you cannot get enough fiber from foods.   Drink enough fluids to keep your urine clear or pale yellow.   Exercise regularly or as directed by your health care provider.   Go to the restroom when you have the urge to go. Do not hold it.   Only take over-the-counter or prescription medicines as directed by your health care provider. Do not take other medicines for constipation without talking to your health care provider first.  SEEK IMMEDIATE MEDICAL CARE IF:   You have bright red blood in your stool.   Your constipation lasts for more than 4 days or gets worse.   You have abdominal or rectal pain.   You have  thin, pencil-like stools.   You have unexplained weight loss. MAKE SURE YOU:   Understand these instructions.  Will watch your condition.  Will get help right away if you are not doing well or get worse. Document Released: 03/03/2004 Document Revised: 06/10/2013 Document Reviewed: 03/17/2013 [facility] Patient Information 2015 [place], [place]. This information is not intended to replace advice given to you by your health care provider. Make sure you discuss any questions you have with your health care provider.

## 2014-06-10 LAB — HIV ANTIBODY (ROUTINE TESTING W REFLEX): HIV 1&2 Ab, 4th Generation: NONREACTIVE

## 2014-06-10 LAB — GC/CHLAMYDIA PROBE AMP
CT PROBE, AMP APTIMA: NEGATIVE
GC Probe RNA: NEGATIVE

## 2014-06-11 LAB — CULTURE, OB URINE
Colony Count: >=100000
SPECIAL REQUESTS: NORMAL

## 2014-07-15 ENCOUNTER — Inpatient Hospital Stay (HOSPITAL_COMMUNITY)
Admission: AD | Admit: 2014-07-15 | Discharge: 2014-07-15 | Disposition: A | Discharge status: Home / Self Care | Payer: Medicaid Other | Source: Ambulatory Visit | Attending: Obstetrics & Gynecology | Admitting: Obstetrics & Gynecology

## 2014-07-15 ENCOUNTER — Encounter (HOSPITAL_COMMUNITY): Payer: Self-pay | Admitting: Advanced Practice Midwife

## 2014-07-15 DIAGNOSIS — R109 Unspecified abdominal pain: Secondary | ICD-10-CM | POA: Diagnosis not present

## 2014-07-15 DIAGNOSIS — O26851 Spotting complicating pregnancy, first trimester: Secondary | ICD-10-CM | POA: Insufficient documentation

## 2014-07-15 DIAGNOSIS — O98812 Other maternal infectious and parasitic diseases complicating pregnancy, second trimester: Secondary | ICD-10-CM

## 2014-07-15 DIAGNOSIS — B3731 Acute candidiasis of vulva and vagina: Secondary | ICD-10-CM

## 2014-07-15 DIAGNOSIS — Z3A13 13 weeks gestation of pregnancy: Secondary | ICD-10-CM | POA: Insufficient documentation

## 2014-07-15 DIAGNOSIS — Z3A15 15 weeks gestation of pregnancy: Secondary | ICD-10-CM

## 2014-07-15 DIAGNOSIS — B373 Candidiasis of vulva and vagina: Secondary | ICD-10-CM

## 2014-07-15 DIAGNOSIS — O209 Hemorrhage in early pregnancy, unspecified: Secondary | ICD-10-CM

## 2014-07-15 LAB — URINALYSIS, ROUTINE W REFLEX MICROSCOPIC
Bilirubin Urine: NEGATIVE
GLUCOSE, UA: NEGATIVE mg/dL
Ketones, ur: NEGATIVE mg/dL
Nitrite: NEGATIVE
PROTEIN: NEGATIVE mg/dL
Specific Gravity, Urine: 1.02 (ref 1.005–1.030)
Urobilinogen, UA: 0.2 mg/dL (ref 0.0–1.0)
pH: 7 (ref 5.0–8.0)

## 2014-07-15 LAB — WET PREP, GENITAL: Trich, Wet Prep: NONE SEEN

## 2014-07-15 LAB — URINE MICROSCOPIC-ADD ON

## 2014-07-15 MED ORDER — FLUCONAZOLE 150 MG PO TABS: 150.0000 mg | ORAL_TABLET | Freq: Once | ORAL | Status: DC

## 2014-07-15 NOTE — Progress Notes (Signed)
Wet prep 

## 2014-07-15 NOTE — MAU Note (Signed)
Pt states she has had  Some cramping and had some vaginal bleeding

## 2014-07-15 NOTE — MAU Note (Signed)
Pt noted spotting @ 0200 this a.m., was dx'd with UTI recently, finished medication.  Has not spotted since.  Has had occasional abd pain for the last week, none now.

## 2014-07-15 NOTE — MAU Note (Signed)
Bedside ultrasound done by M. Mayford KnifeWilliams CNM

## 2014-07-15 NOTE — Discharge Instructions (Signed)
Candidal Vulvovaginitis Candidal vulvovaginitis is an infection of the vagina and vulva. The vulva is the skin around the opening of the vagina. This may cause itching and discomfort in and around the vagina.  HOME CARE  Only take medicine as told by your doctor.  Do not have sex (intercourse) until the infection is healed or as told by your doctor.  Practice safe sex.  Tell your sex partner about your infection.  Do not douche or use tampons.  Wear cotton underwear. Do not wear tight pants or panty hose.  Eat yogurt. This may help treat and prevent yeast infections. GET HELP RIGHT AWAY IF:   You have a fever.  Your problems get worse during treatment or do not get better in 3 days.  You have discomfort, irritation, or itching in your vagina or vulva area.  You have pain after sex.  You start to get belly (abdominal) pain. MAKE SURE YOU:  Understand these instructions.  Will watch your condition.  Will get help right away if you are not doing well or get worse. Document Released: 09/01/2008 Document Revised: 06/10/2013 Document Reviewed: 09/01/2008 [employer] Patient Information 2015 [place]Care, [place]. This information is not intended to replace advice given to you by your health care provider. Make sure you discuss any questions you have with your health care provider.  [facility]    [facility]  [phone]     Phone: [phone]          [facility]                      [facility]  @[email or web address]     Phone: [phone]  Phone: [phone]         [facility]     Phone: [phone]  Phone: [phone]           [facility] [place]                hone: [phone]  Phone: [phone]         [facility] Francoise CeoBernard Marshall      Phone: [phone]  Phone: [phone]         [facility]                Phone: [phone]  [facility]   [address]Phone:[phone]    [facility] [place]          Phone: [phone] [facility]   Phone: [phone]

## 2014-07-15 NOTE — MAU Provider Note (Signed)
History     CSN: [id number]  Arrival date and time: 07/15/14 1626   None     Chief Complaint  Patient presents with  . Vaginal Bleeding  . Abdominal Pain   HPI This is a 24 y.o. female at [redacted]w[redacted]d who presents with c/o spotting once last night. No spotting since. Had some cramping last week but none now.  Already being treated for UTI  RN Note: Pt noted spotting @ 0200 this a.m., was dx'd with UTI recently, finished medication. Has not spotted since. Has had occasional abd pain for the last week, none now.          OB History    Gravida Para Term Preterm AB TAB SAB Ectopic Multiple Living   Past Medical History  Diagnosis Date  . UTI (lower urinary tract infection)   . No pertinent past medical history   . Depression   . Anxiety     Past Surgical History  Procedure Laterality Date  . No past surgeries      No family history on file.  History  Substance Use Topics  . Smoking status: Never Smoker   . Smokeless tobacco: Never Used  . Alcohol Use: No    Allergies: No Known Allergies  Prescriptions prior to admission  Medication Sig Dispense Refill Last Dose  . acetaminophen (TYLENOL) 500 MG tablet Take 1,000 mg by mouth every 6 (six) hours as needed for mild pain.   unknown  . cephALEXin (KEFLEX) 500 MG capsule Take 1 capsule (500 mg total) by mouth 4 (four) times daily. 20 capsule 0   . metoCLOPramide (REGLAN) 10 MG tablet Take 1 tablet (10 mg total) by mouth 3 (three) times daily as needed for nausea (headache / nausea). 20 tablet 0   . oxyCODONE-acetaminophen (PERCOCET/ROXICET) 5-325 MG per tablet Take 2 tablets by mouth every 6 (six) hours as needed for severe pain. (Patient not taking: Reported on 05/13/2014) 15 tablet 0   . sulfamethoxazole-trimethoprim (SEPTRA DS) 800-160 MG per tablet Take 1 tablet by mouth every 12 (twelve) hours. (Patient not taking: Reported on 05/13/2014) 20 tablet 0     Review of Systems  Constitutional:  Negative for fever and chills.  Gastrointestinal: Negative for nausea, vomiting and abdominal pain.  Musculoskeletal: Negative for back pain and neck pain.  Neurological: Negative for dizziness.   Physical Exam   Blood pressure 103/62, pulse 85, temperature 97.7 F (36.5 C), temperature source Oral, resp. rate 18, height  (1.676 m), weight 154 lb 3.2 oz (69.945 kg), last menstrual period 03/28/2014, unknown if currently breastfeeding.  Physical Exam  Constitutional: She is oriented to person, place, and time. She appears well-developed and well-nourished. No distress.  HENT:  Head: Normocephalic.  Cardiovascular: Normal rate.   Respiratory: Effort normal.  GI: Soft. She exhibits no distension. There is no tenderness. There is no rebound and no guarding.  Genitourinary: Vaginal discharge (Yeast appearing discharge, clumpy, greenish white) found.  Cervix long and closed  Musculoskeletal: Normal range of motion.  Neurological: She is alert and oriented to person, place, and time.  Skin: Skin is warm and dry.  Psychiatric: She has a normal mood and affect.    MAU Course  Procedures  MDM Results for MARESA, MORASH (MRN [id number]) as of 07/21/2014 01:14  Ref. Range 07/15/2014 16:55  Color, Urine Latest Range: YELLOW  YELLOW  APPearance Latest Range: CLEAR  HAZY (  A)  Specific Gravity, Urine Latest Range: 1.005-1.030  1.020  pH Latest Range: 5.0-8.0  7.0  Glucose Latest Range: NEGATIVE mg/dL NEGATIVE  Bilirubin Urine Latest Range: NEGATIVE  NEGATIVE  Ketones, ur Latest Range: NEGATIVE mg/dL NEGATIVE  Protein Latest Range: NEGATIVE mg/dL NEGATIVE  Urobilinogen, UA Latest Range: 0.0-1.0 mg/dL 0.2  Nitrite Latest Range: NEGATIVE  NEGATIVE  Leukocytes, UA Latest Range: NEGATIVE  LARGE (A)  Hgb urine dipstick Latest Range: NEGATIVE  SMALL (A)  Urine-Other No range found FEW YEAST  WBC, UA Latest Range: <3 WBC/hpf 7-10  Squamous Epithelial / LPF Latest Range: RARE  MANY (A)   Bacteria, UA Latest Range: RARE  MANY (A)  Results for Rachael DarbyWRIGHT, Avalon J (MRN [id number]) as of 07/21/2014 01:14  Ref. Range 07/15/2014 19:00  Yeast Wet Prep HPF POC Latest Range: NONE SEEN  MODERATE (A)  Trich, Wet Prep Latest Range: NONE SEEN  NONE SEEN  Clue Cells Wet Prep HPF POC Latest Range: NONE SEEN  FEW (A)  WBC, Wet Prep HPF POC Latest Range: NONE SEEN  FEW (A)    Assessment and Plan  A:  Pregnancy at 15wd4d      Yeast vaginitis       No further bleeding  P;  Discharge home         Medication List    TAKE these medications        acetaminophen 500 MG tablet  Commonly known as:  TYLENOL  Take 1,000 mg by mouth every 6 (six) hours as needed for mild pain.     cephALEXin 500 MG capsule  Commonly known as:  KEFLEX  Take 1 capsule (500 mg total) by mouth 4 (four) times daily.     fluconazole 150 MG tablet  Commonly known as:  DIFLUCAN  Take 1 tablet (150 mg total) by mouth once.     metoCLOPramide 10 MG tablet  Commonly known as:  REGLAN  Take 1 tablet (10 mg total) by mouth 3 (three) times daily as needed for nausea (headache / nausea).     oxyCODONE-acetaminophen 5-325 MG per tablet  Commonly known as:  PERCOCET/ROXICET  Take 2 tablets by mouth every 6 (six) hours as needed for severe pain.     sulfamethoxazole-trimethoprim 800-160 MG per tablet  Commonly known as:  SEPTRA DS  Take 1 tablet by mouth every 12 (twelve) hours.         [facility] Memorial HospitalWILLIAMS,MARIE 07/15/2014, 6:44 PM

## 2014-09-03 ENCOUNTER — Emergency Department (HOSPITAL_COMMUNITY)
Admission: EM | Admit: 2014-09-03 | Discharge: 2014-09-03 | Disposition: A | Discharge status: Home / Self Care | Payer: Medicaid Other | Source: Home / Self Care | Attending: Emergency Medicine | Admitting: Emergency Medicine

## 2014-09-03 ENCOUNTER — Encounter (HOSPITAL_COMMUNITY): Payer: Self-pay | Admitting: Emergency Medicine

## 2014-09-03 ENCOUNTER — Emergency Department (HOSPITAL_COMMUNITY)
Admission: EM | Admit: 2014-09-03 | Discharge: 2014-09-04 | Disposition: A | Discharge status: Home / Self Care | Payer: Medicaid Other | Attending: Emergency Medicine | Admitting: Emergency Medicine

## 2014-09-03 DIAGNOSIS — J029 Acute pharyngitis, unspecified: Secondary | ICD-10-CM | POA: Insufficient documentation

## 2014-09-03 DIAGNOSIS — R05 Cough: Secondary | ICD-10-CM

## 2014-09-03 DIAGNOSIS — Z792 Long term (current) use of antibiotics: Secondary | ICD-10-CM

## 2014-09-03 DIAGNOSIS — N898 Other specified noninflammatory disorders of vagina: Secondary | ICD-10-CM | POA: Insufficient documentation

## 2014-09-03 DIAGNOSIS — H9201 Otalgia, right ear: Secondary | ICD-10-CM | POA: Insufficient documentation

## 2014-09-03 DIAGNOSIS — J069 Acute upper respiratory infection, unspecified: Secondary | ICD-10-CM

## 2014-09-03 DIAGNOSIS — Z3202 Encounter for pregnancy test, result negative: Secondary | ICD-10-CM | POA: Insufficient documentation

## 2014-09-03 DIAGNOSIS — Z87440 Personal history of urinary (tract) infections: Secondary | ICD-10-CM | POA: Diagnosis not present

## 2014-09-03 DIAGNOSIS — R059 Cough, unspecified: Secondary | ICD-10-CM

## 2014-09-03 DIAGNOSIS — R6889 Other general symptoms and signs: Secondary | ICD-10-CM

## 2014-09-03 DIAGNOSIS — M791 Myalgia: Secondary | ICD-10-CM | POA: Diagnosis not present

## 2014-09-03 DIAGNOSIS — Z8659 Personal history of other mental and behavioral disorders: Secondary | ICD-10-CM

## 2014-09-03 DIAGNOSIS — R1110 Vomiting, unspecified: Secondary | ICD-10-CM | POA: Diagnosis present

## 2014-09-03 LAB — CBC WITH DIFFERENTIAL/PLATELET
Basophils Absolute: 0 10*3/uL (ref 0.0–0.1)
Basophils Relative: 0 % (ref 0–1)
EOS PCT: 1 % (ref 0–5)
Eosinophils Absolute: 0 10*3/uL (ref 0.0–0.7)
HEMATOCRIT: 37.7 % (ref 36.0–46.0)
HEMOGLOBIN: 12.8 g/dL (ref 12.0–15.0)
Lymphocytes Relative: 11 % — ABNORMAL LOW (ref 12–46)
Lymphs Abs: 0.7 10*3/uL (ref 0.7–4.0)
MCH: 29.8 pg (ref 26.0–34.0)
MCHC: 34 g/dL (ref 30.0–36.0)
MCV: 87.9 fL (ref 78.0–100.0)
MONOS PCT: 7 % (ref 3–12)
Monocytes Absolute: 0.4 10*3/uL (ref 0.1–1.0)
Neutro Abs: 4.9 10*3/uL (ref 1.7–7.7)
Neutrophils Relative %: 81 % — ABNORMAL HIGH (ref 43–77)
Platelets: 317 10*3/uL (ref 150–400)
RBC: 4.29 MIL/uL (ref 3.87–5.11)
RDW: 12.2 % (ref 11.5–15.5)
WBC: 6 10*3/uL (ref 4.0–10.5)

## 2014-09-03 LAB — I-STAT CG4 LACTIC ACID, ED: Lactic Acid, Venous: 2.19 mmol/L (ref 0.5–2.0)

## 2014-09-03 MED ORDER — ACETAMINOPHEN 325 MG PO TABS
650.0000 mg | ORAL_TABLET | Freq: Once | ORAL | Status: AC
  Administered 2014-09-03: 650 mg via ORAL

## 2014-09-03 MED ORDER — ACETAMINOPHEN 325 MG PO TABS
ORAL_TABLET | ORAL | Status: AC
  Filled 2014-09-03: qty 2

## 2014-09-03 NOTE — ED Notes (Signed)
R ear pain x 1 week.  Reports sore throat and productive cough with yellowish-brown sputum since Wednesday afternoon.

## 2014-09-03 NOTE — ED Provider Notes (Signed)
CSN: [id number]     Arrival date & time 09/03/14  0141 History  This chart was scribed for Zadie Rhineonald Ed Mandich, MD by Bronson CurbJacqueline Melvin, ED Scribe. This patient was seen in room A12C/A12C and the patient's care was started at 3:06 AM.   Chief Complaint  Patient presents with  . Sore Throat  . Otalgia   Patient is a 24 y.o. female presenting with pharyngitis and ear pain. The history is provided by the patient. No language interpreter was used.  Sore Throat This is a new problem. The current episode started 3 to 5 hours ago. The problem has not changed since onset.Nothing aggravates the symptoms. Nothing relieves the symptoms. She has tried nothing for the symptoms.  Otalgia Location:  Right Severity:  Moderate Duration:  1 week Progression:  Unchanged Chronicity:  New Relieved by:  None tried Worsened by:  Nothing tried Ineffective treatments:  None tried Associated symptoms: cough and sore throat   Associated symptoms: no fever, no neck pain and no vomiting      HPI Comments: Alisha Darbyiffany J Wall is a 24 y.o. female who presents to the Emergency Department complaining of constant, moderate, right ear pain for the past week. There is associated cough productive of brown-yellow sputum onset yesterday, but worse tonight. She also notes sudden onset sore throat that began 5 hours ago. Patient has not taken for symptom relief. She denies any significant respiratory history. She further denies fever, chills, generalized body aches, nausea, vomiting, diarrhea, or abdominal pain.  LNMP 1 week ago   Past Medical History  Diagnosis Date  . UTI (lower urinary tract infection)   . No pertinent past medical history   . Depression   . Anxiety    Past Surgical History  Procedure Laterality Date  . No past surgeries     No family history on file. History  Substance Use Topics  . Smoking status: Never Smoker   . Smokeless tobacco: Never Used  . Alcohol Use: No   OB History    Gravida Para Term  Preterm AB TAB SAB Ectopic Multiple Living   5 2 2  2 1 1   2      Review of Systems  Constitutional: Negative for fever.  HENT: Positive for ear pain and sore throat.   Respiratory: Positive for cough.   Gastrointestinal: Negative for nausea and vomiting.  Musculoskeletal: Negative for myalgias and neck pain.      Allergies  Review of patient's allergies indicates no known allergies.  Home Medications   Prior to Admission medications   Medication Sig Start Date End Date Taking? Authorizing Provider  acetaminophen (TYLENOL) 500 MG tablet Take 1,000 mg by mouth every 6 (six) hours as needed for mild pain.    Historical Provider, MD  cephALEXin (KEFLEX) 500 MG capsule Take 1 capsule (500 mg total) by mouth 4 (four) times daily. 05/13/14   Junious SilkHannah Merrell, PA-C  fluconazole (DIFLUCAN) 150 MG tablet Take 1 tablet (150 mg total) by mouth once. 07/15/14   Aviva SignsMarie L Williams, CNM  metoCLOPramide (REGLAN) 10 MG tablet Take 1 tablet (10 mg total) by mouth 3 (three) times daily as needed for nausea (headache / nausea). 05/13/14   Junious SilkHannah Merrell, PA-C  oxyCODONE-acetaminophen (PERCOCET/ROXICET) 5-325 MG per tablet Take 2 tablets by mouth every 6 (six) hours as needed for severe pain. Patient not taking: Reported on 05/13/2014 02/15/14   Roxy Horsemanobert Browning, PA-C  sulfamethoxazole-trimethoprim (SEPTRA DS) 800-160 MG per tablet Take 1 tablet by mouth every 12 (  twelve) hours. Patient not taking: Reported on 05/13/2014 02/15/14   Roxy Horseman, PA-C   Triage Vitals: BP 104/73 mmHg  Pulse 78  Temp(Src) 98.3 F (36.8 C) (Oral)  Resp 18  Ht  (1.676 m)  SpO2 98%  LMP 08/20/2014  Breastfeeding? No  Physical Exam  Nursing note and vitals reviewed.  CONSTITUTIONAL: Well developed/well nourished HEAD: Normocephalic/atraumatic EYES: EOMI/PERRL ENMT: Mucous membranes moist. Uvula midline. No erythema or exudate. Bilateral TMs intact without erythema NECK: supple no meningeal signs CV: S1/S2 noted,  no murmurs/rubs/gallops noted LUNGS: Lungs are clear to auscultation bilaterally, no apparent distress ABDOMEN: soft, nontender, no rebound or guarding, bowel sounds noted throughout abdomen NEURO: Pt is awake/alert/appropriate, moves all extremitiesx4.  No facial droop.   EXTREMITIES: pulses normal/equal, full ROM SKIN: warm, color normal PSYCH: no abnormalities of mood noted, alert and oriented to situation  ED Course  Procedures  DIAGNOSTIC STUDIES: Oxygen Saturation is 98% on room air, normal by my interpretation.    COORDINATION OF CARE: At 0309 Discussed treatment plan with patient which includes OTC Tylenol or decongestant. Patient agrees.     MDM   Final diagnoses:  Cough  Sore throat    Nursing notes including past medical history and social history reviewed and considered in documentation   I personally performed the services described in this documentation, which was scribed in my presence. The recorded information has been reviewed and is accurate.   ,  Zadie Rhine, MD 09/03/14 [phone]

## 2014-09-03 NOTE — ED Notes (Addendum)
Onset 2 days ago productive cough yellow green sputum, abdominal pain with nausea emesis denies diarrhea. States general weakness. Pain currently 10/10 achy sore. Stated developed vaginal discharge for one week.

## 2014-09-03 NOTE — ED Notes (Signed)
Pt states she had positive preg test 08/20/14.  Since then, vaginal bleeding and discharge.  Reports loss of appetite.  Fever for 2 days.  Able to keep sips of fluids down.

## 2014-09-04 ENCOUNTER — Emergency Department (HOSPITAL_COMMUNITY): Payer: Medicaid Other

## 2014-09-04 LAB — COMPREHENSIVE METABOLIC PANEL
ALBUMIN: 4 g/dL (ref 3.5–5.2)
ALT: 15 U/L (ref 0–35)
ANION GAP: 6 (ref 5–15)
AST: 24 U/L (ref 0–37)
Alkaline Phosphatase: 63 U/L (ref 39–117)
BUN: <5 mg/dL — ABNORMAL LOW (ref 6–23)
CO2: 27 mmol/L (ref 19–32)
Calcium: 8.9 mg/dL (ref 8.4–10.5)
Chloride: 108 mmol/L (ref 96–112)
Creatinine, Ser: 0.84 mg/dL (ref 0.50–1.10)
GFR calc Af Amer: >90 mL/min (ref >90)
Glucose, Bld: 110 mg/dL — ABNORMAL HIGH (ref 70–99)
POTASSIUM: 3.1 mmol/L — AB (ref 3.5–5.1)
SODIUM: 141 mmol/L (ref 135–145)
Total Bilirubin: 1.2 mg/dL (ref 0.3–1.2)
Total Protein: 7.3 g/dL (ref 6.0–8.3)

## 2014-09-04 LAB — URINALYSIS, ROUTINE W REFLEX MICROSCOPIC
BILIRUBIN URINE: NEGATIVE
Glucose, UA: NEGATIVE mg/dL
Ketones, ur: NEGATIVE mg/dL
Nitrite: NEGATIVE
Protein, ur: NEGATIVE mg/dL
SPECIFIC GRAVITY, URINE: 1.008 (ref 1.005–1.030)
UROBILINOGEN UA: 0.2 mg/dL (ref 0.0–1.0)
pH: 7 (ref 5.0–8.0)

## 2014-09-04 LAB — URINE MICROSCOPIC-ADD ON

## 2014-09-04 LAB — POC URINE PREG, ED: Preg Test, Ur: NEGATIVE

## 2014-09-04 LAB — HCG, QUANTITATIVE, PREGNANCY: hCG, Beta Chain, Quant, S: 3 m[IU]/mL (ref <5)

## 2014-09-04 LAB — LIPASE, BLOOD: LIPASE: 28 U/L (ref 11–59)

## 2014-09-04 LAB — RAPID STREP SCREEN (MED CTR MEBANE ONLY): STREPTOCOCCUS, GROUP A SCREEN (DIRECT): NEGATIVE

## 2014-09-04 MED ORDER — SODIUM CHLORIDE 0.9 % IV SOLN
INTRAVENOUS | Status: DC
  Administered 2014-09-04: 03:00:00 via INTRAVENOUS

## 2014-09-04 MED ORDER — ONDANSETRON HCL 4 MG/2ML IJ SOLN
4.0000 mg | Freq: Once | INTRAMUSCULAR | Status: AC
  Administered 2014-09-04: 4 mg via INTRAVENOUS
  Filled 2014-09-04: qty 2

## 2014-09-04 MED ORDER — SODIUM CHLORIDE 0.9 % IV BOLUS (SEPSIS)
1000.0000 mL | Freq: Once | INTRAVENOUS | Status: AC
  Administered 2014-09-04: 1000 mL via INTRAVENOUS

## 2014-09-04 MED ORDER — PROMETHAZINE HCL 25 MG PO TABS: 25.0000 mg | ORAL_TABLET | Freq: Four times a day (QID) | ORAL | Status: DC | PRN

## 2014-09-04 MED ORDER — DM-GUAIFENESIN ER 30-600 MG PO TB12: 1.0000 | ORAL_TABLET | Freq: Two times a day (BID) | ORAL | Status: DC

## 2014-09-04 MED ORDER — NAPROXEN 500 MG PO TABS: 500.0000 mg | ORAL_TABLET | Freq: Two times a day (BID) | ORAL | Status: DC

## 2014-09-04 NOTE — ED Provider Notes (Signed)
CSN: [id number]     Arrival date & time 09/03/14  2246 History  This chart was scribed for Alisha Mulders, MD by Bronson Curb, ED Scribe. This patient was seen in room B17C/B17C and the patient's care was started at 1:11 AM.   Chief Complaint  Patient presents with  . Abdominal Pain  . Emesis  . Vaginal Discharge   Patient is a 24 y.o. female presenting with abdominal pain, vomiting, and vaginal discharge. The history is provided by the patient. No language interpreter was used.  Abdominal Pain Pain location: "lower" Pain radiates to:  Does not radiate Pain severity:  Moderate Onset quality:  Gradual Duration:  2 days Timing:  Constant Progression:  Worsening Chronicity:  New Relieved by:  None tried Worsened by:  Nothing tried Ineffective treatments:  None tried Associated symptoms: chills, cough, fever, nausea, shortness of breath, sore throat, vaginal discharge and vomiting   Associated symptoms: no chest pain, no diarrhea and no dysuria   Cough:    Cough characteristics:  Productive   Severity:  Moderate   Duration:  1 week   Timing:  Intermittent   Progression:  Worsening   Chronicity:  New Emesis Associated symptoms: abdominal pain, chills, headaches, myalgias and sore throat   Associated symptoms: no diarrhea   Vaginal Discharge Associated symptoms: abdominal pain, fever, nausea and vomiting   Associated symptoms: no dysuria     HPI Comments: KAYLYNNE ANDRES is a 24 y.o. female who presents to the Emergency Department complaining of cold symptoms for the past week. Patient reports she has been coughing (yellow green sputum) for a week, then later developed fever (triage temp 99.5 F), rhinorrhea, headache, blurred vision, cold sweats, SOB, sore throat, 2 days ago. She also notes lower abdominal pain, nausea and 4 episodes of vomiting. She was seen here last night for right ear pain, however, she now reports worsening fever, sore throat, generalized body aches, and  decreased appetite. Patient reports taking Tylenol for fever with relief. She notes sick contacts with son who has similar symptoms. Patient reports positive home pregnancy test 15 days ago on August 20, 2014. G3P2A0. LNMP 07/31/14. She denies chest pain, leg swelling, diarrhea, dysuria, back pain, neck pain, rash, dizziness, or light-headedness.  O2 sat is 98%   Past Medical History  Diagnosis Date  . UTI (lower urinary tract infection)   . No pertinent past medical history   . Depression   . Anxiety    Past Surgical History  Procedure Laterality Date  . No past surgeries     No family history on file. History  Substance Use Topics  . Smoking status: Never Smoker   . Smokeless tobacco: Never Used  . Alcohol Use: No   OB History    Gravida Para Term Preterm AB TAB SAB Ectopic Multiple Living   Review of Systems  Constitutional: Positive for fever, chills, diaphoresis and appetite change.  HENT: Positive for rhinorrhea and sore throat.   Eyes: Positive for visual disturbance.  Respiratory: Positive for cough and shortness of breath.   Cardiovascular: Negative for chest pain and leg swelling.  Gastrointestinal: Positive for nausea, vomiting and abdominal pain. Negative for diarrhea.  Genitourinary: Positive for vaginal discharge. Negative for dysuria.  Musculoskeletal: Positive for myalgias. Negative for back pain and neck pain.  Skin: Negative for rash.  Neurological: Positive for headaches. Negative for dizziness and light-headedness.  Hematological: Does not  bruise/bleed easily.  Psychiatric/Behavioral: Negative for confusion.      Allergies  Review of patient's allergies indicates no known allergies.  Home Medications   Prior to Admission medications   Medication Sig Start Date End Date Taking? Authorizing Provider  acetaminophen (TYLENOL) 500 MG tablet Take 1,000 mg by mouth every 6 (six) hours as needed for mild pain.   Yes Historical  Provider, MD  dextromethorphan-guaiFENesin (MUCINEX DM) 30-600 MG per 12 hr tablet Take 1 tablet by mouth 2 (two) times daily. 09/04/14   Alisha Mulders, MD  naproxen (NAPROSYN) 500 MG tablet Take 1 tablet (500 mg total) by mouth 2 (two) times daily. 09/04/14   Alisha Mulders, MD  promethazine (PHENERGAN) 25 MG tablet Take 1 tablet (25 mg total) by mouth every 6 (six) hours as needed for nausea or vomiting. 09/04/14   Alisha Mulders, MD   Triage Vitals: BP 97/63 mmHg  Pulse 110  Temp(Src) 99.5 F (37.5 C) (Oral)  Resp 18  Ht 5\' 6"  (1.676 m)  Wt 154 lb (69.854 kg)  BMI 24.87 kg/m2  SpO2 95%  LMP 08/20/2014  Physical Exam  Constitutional: She is oriented to person, place, and time. She appears well-developed and well-nourished. No distress.  HENT:  Head: Normocephalic and atraumatic.  Mouth/Throat: Mucous membranes are normal. Oropharyngeal exudate and posterior oropharyngeal erythema present.  Some coating on the tongue  Eyes: Conjunctivae and EOM are normal.  Sclera is clear. Pupils normal. Eyes tracking normal.  Neck: Neck supple. No tracheal deviation present.  Cardiovascular: Normal rate, regular rhythm and normal heart sounds.   No murmur heard. Pulmonary/Chest: Effort normal and breath sounds normal. No respiratory distress.  Abdominal: Soft. Bowel sounds are normal. There is no tenderness.  Musculoskeletal: Normal range of motion. She exhibits no edema.  No swelling in the ankles.  Neurological: She is alert and oriented to person, place, and time.  Skin: Skin is warm and dry.  Psychiatric: She has a normal mood and affect. Her behavior is normal.  Nursing note and vitals reviewed.   ED Course  Procedures (including critical care time)  DIAGNOSTIC STUDIES: Oxygen Saturation is 95% on room air, adequate by my interpretation.    COORDINATION OF CARE: At 0117 Discussed treatment plan with patient which includes IV fluids, strep test, and UA. Patient agrees.   Labs  Review Labs Reviewed  CBC WITH DIFFERENTIAL/PLATELET - Abnormal; Notable for the following:    Neutrophils Relative % 81 (*)    Lymphocytes Relative 11 (*)    All other components within normal limits  COMPREHENSIVE METABOLIC PANEL - Abnormal; Notable for the following:    Potassium 3.1 (*)    Glucose, Bld 110 (*)    BUN <5 (*)    All other components within normal limits  URINALYSIS, ROUTINE W REFLEX MICROSCOPIC - Abnormal; Notable for the following:    APPearance CLOUDY (*)    Hgb urine dipstick TRACE (*)    Leukocytes, UA MODERATE (*)    All other components within normal limits  URINE MICROSCOPIC-ADD ON - Abnormal; Notable for the following:    Bacteria, UA FEW (*)    All other components within normal limits  I-STAT CG4 LACTIC ACID, ED - Abnormal; Notable for the following:    Lactic Acid, Venous 2.19 (*)    All other components within normal limits  RAPID STREP SCREEN  CULTURE, GROUP A STREP  URINE CULTURE  LIPASE, BLOOD  HCG, QUANTITATIVE, PREGNANCY  POC URINE PREG, ED   Results  for orders placed or performed during the hospital encounter of 09/03/14  Rapid strep screen  Result Value Ref Range   Streptococcus, Group A Screen (Direct) NEGATIVE NEGATIVE  CBC with Differential  Result Value Ref Range   WBC 6.0 4.0 - 10.5 K/uL   RBC 4.29 3.87 - 5.11 MIL/uL   Hemoglobin 12.8 12.0 - 15.0 g/dL   HCT 01.037.7 27.236.0 - 53.646.0 %   MCV 87.9 78.0 - 100.0 fL   MCH 29.8 26.0 - 34.0 pg   MCHC 34.0 30.0 - 36.0 g/dL   RDW 64.412.2 03.411.5 - 74.215.5 %   Platelets 317 150 - 400 K/uL   Neutrophils Relative % 81 (H) 43 - 77 %   Neutro Abs 4.9 1.7 - 7.7 K/uL   Lymphocytes Relative 11 (L) 12 - 46 %   Lymphs Abs 0.7 0.7 - 4.0 K/uL   Monocytes Relative 7 3 - 12 %   Monocytes Absolute 0.4 0.1 - 1.0 K/uL   Eosinophils Relative 1 0 - 5 %   Eosinophils Absolute 0.0 0.0 - 0.7 K/uL   Basophils Relative 0 0 - 1 %   Basophils Absolute 0.0 0.0 - 0.1 K/uL  Comprehensive metabolic panel  Result Value Ref  Range   Sodium 141 135 - 145 mmol/L   Potassium 3.1 (L) 3.5 - 5.1 mmol/L   Chloride 108 96 - 112 mmol/L   CO2 27 19 - 32 mmol/L   Glucose, Bld 110 (H) 70 - 99 mg/dL   BUN <5 (L) 6 - 23 mg/dL   Creatinine, Ser 5.950.84 0.50 - 1.10 mg/dL   Calcium 8.9 8.4 - 63.810.5 mg/dL   Total Protein 7.3 6.0 - 8.3 g/dL   Albumin 4.0 3.5 - 5.2 g/dL   AST 24 0 - 37 U/L   ALT 15 0 - 35 U/L   Alkaline Phosphatase 63 39 - 117 U/L   Total Bilirubin 1.2 0.3 - 1.2 mg/dL   GFR calc non Af Amer >90 >90 mL/min   GFR calc Af Amer >90 >90 mL/min   Anion gap 6 5 - 15  Lipase, blood  Result Value Ref Range   Lipase 28 11 - 59 U/L  Urinalysis, Routine w reflex microscopic  Result Value Ref Range   Color, Urine YELLOW YELLOW   APPearance CLOUDY (A) CLEAR   Specific Gravity, Urine 1.008 1.005 - 1.030   pH 7.0 5.0 - 8.0   Glucose, UA NEGATIVE NEGATIVE mg/dL   Hgb urine dipstick TRACE (A) NEGATIVE   Bilirubin Urine NEGATIVE NEGATIVE   Ketones, ur NEGATIVE NEGATIVE mg/dL   Protein, ur NEGATIVE NEGATIVE mg/dL   Urobilinogen, UA 0.2 0.0 - 1.0 mg/dL   Nitrite NEGATIVE NEGATIVE   Leukocytes, UA MODERATE (A) NEGATIVE  hCG, quantitative, pregnancy  Result Value Ref Range   hCG, Beta Chain, Quant, S 3 <5 mIU/mL  Urine microscopic-add on  Result Value Ref Range   Squamous Epithelial / LPF RARE RARE   WBC, UA 7-10 <3 WBC/hpf   RBC / HPF 0-2 <3 RBC/hpf   Bacteria, UA FEW (A) RARE  I-Stat CG4 Lactic Acid, ED  Result Value Ref Range   Lactic Acid, Venous 2.19 (HH) 0.5 - 2.0 mmol/L   Comment NOTIFIED PHYSICIAN   POC Urine Pregnancy,  (if pre-menopausal female) [facility]  Result Value Ref Range   Preg Test, Ur NEGATIVE NEGATIVE     Imaging Review Dg Chest 2 View  09/04/2014   CLINICAL DATA:  Cough aches  and chills for 1 week  EXAM: CHEST  2 VIEW  COMPARISON:  05/13/2014  FINDINGS: The heart size and mediastinal contours are within normal limits. Both lungs are clear. The visualized skeletal structures are  unremarkable.  IMPRESSION: No active cardiopulmonary disease.   Electronically Signed   By: Ellery Plunk M.D.   On: 09/04/2014 04:50     EKG Interpretation None      MDM   Final diagnoses:  Cough  Flu-like symptoms  URI (upper respiratory infection)     Patient with extensive workup. First determined that she was not pregnant. Patient thought perhaps she was. Symptoms seem to be consistent with flulike illness. Sore throat negative for strep throat. Chest x-ray negative for pneumonia. Urinalysis not consistent with UTI. Pregnancy test negative. Will treat symptomatically with Mucinex DM Naprosyn and Phenergan. Urine culture sent for confirmation on the urine.  Work note provided. Patient nontoxic no acute distress at discharge.   I personally performed the services described in this documentation, which was scribed in my presence. The recorded information has been reviewed and is accurate.     Alisha Mulders, MD 09/04/14 [phone]

## 2014-09-04 NOTE — ED Notes (Signed)
Pt en route to Radiology for CXR

## 2014-09-04 NOTE — Discharge Instructions (Signed)
Workup negative for strep. No evidence of pneumonia on chest x-ray. Pregnancy test was negative. Symptoms seem to be consistent with a flulike illness. Take the Naprosyn for bodyaches and fever and sore throat. Take Mucinex DM for cough and congestion. Take the Phenergan for nausea and vomiting. Rest for the next few days. Work note provided. Return for any new or worse symptoms.

## 2014-09-05 LAB — CULTURE, GROUP A STREP

## 2014-09-06 LAB — URINE CULTURE: Colony Count: >=100000

## 2014-10-24 ENCOUNTER — Emergency Department (HOSPITAL_COMMUNITY)
Admission: EM | Admit: 2014-10-24 | Discharge: 2014-10-25 | Disposition: A | Discharge status: Home / Self Care | Payer: Medicaid Other | Attending: Emergency Medicine | Admitting: Emergency Medicine

## 2014-10-24 ENCOUNTER — Encounter (HOSPITAL_COMMUNITY): Payer: Self-pay | Admitting: Emergency Medicine

## 2014-10-24 DIAGNOSIS — R110 Nausea: Secondary | ICD-10-CM | POA: Diagnosis present

## 2014-10-24 DIAGNOSIS — N390 Urinary tract infection, site not specified: Secondary | ICD-10-CM | POA: Insufficient documentation

## 2014-10-24 DIAGNOSIS — N12 Tubulo-interstitial nephritis, not specified as acute or chronic: Secondary | ICD-10-CM | POA: Diagnosis not present

## 2014-10-24 DIAGNOSIS — Z8659 Personal history of other mental and behavioral disorders: Secondary | ICD-10-CM | POA: Insufficient documentation

## 2014-10-24 DIAGNOSIS — Z3202 Encounter for pregnancy test, result negative: Secondary | ICD-10-CM | POA: Diagnosis not present

## 2014-10-24 NOTE — ED Notes (Signed)
Pt from home c/o lower abdominal pain started tonight. Nausea present. She reports that it "feels weird when I go to the bathroom".

## 2014-10-25 LAB — WET PREP, GENITAL
Trich, Wet Prep: NONE SEEN
YEAST WET PREP: NONE SEEN

## 2014-10-25 LAB — URINE MICROSCOPIC-ADD ON

## 2014-10-25 LAB — COMPREHENSIVE METABOLIC PANEL
ALT: 16 U/L (ref 14–54)
ANION GAP: 5 (ref 5–15)
AST: 20 U/L (ref 15–41)
Albumin: 4.4 g/dL (ref 3.5–5.0)
Alkaline Phosphatase: 66 U/L (ref 38–126)
BILIRUBIN TOTAL: 0.7 mg/dL (ref 0.3–1.2)
BUN: 13 mg/dL (ref 6–20)
CO2: 26 mmol/L (ref 22–32)
CREATININE: 0.67 mg/dL (ref 0.44–1.00)
Calcium: 9.1 mg/dL (ref 8.9–10.3)
Chloride: 108 mmol/L (ref 101–111)
GFR calc Af Amer: >60 mL/min (ref >60)
GFR calc non Af Amer: >60 mL/min (ref >60)
GLUCOSE: 105 mg/dL — AB (ref 70–99)
POTASSIUM: 3.5 mmol/L (ref 3.5–5.1)
Sodium: 139 mmol/L (ref 135–145)
TOTAL PROTEIN: 8 g/dL (ref 6.5–8.1)

## 2014-10-25 LAB — CBC WITH DIFFERENTIAL/PLATELET
Basophils Absolute: 0 10*3/uL (ref 0.0–0.1)
Basophils Relative: 0 % (ref 0–1)
EOS ABS: 0 10*3/uL (ref 0.0–0.7)
Eosinophils Relative: 0 % (ref 0–5)
HCT: 38.6 % (ref 36.0–46.0)
HEMOGLOBIN: 12.9 g/dL (ref 12.0–15.0)
LYMPHS ABS: 0.9 10*3/uL (ref 0.7–4.0)
LYMPHS PCT: 8 % — AB (ref 12–46)
MCH: 29.8 pg (ref 26.0–34.0)
MCHC: 33.4 g/dL (ref 30.0–36.0)
MCV: 89.1 fL (ref 78.0–100.0)
Monocytes Absolute: 1 10*3/uL (ref 0.1–1.0)
Monocytes Relative: 8 % (ref 3–12)
NEUTROS PCT: 84 % — AB (ref 43–77)
Neutro Abs: 10.6 10*3/uL — ABNORMAL HIGH (ref 1.7–7.7)
Platelets: 388 10*3/uL (ref 150–400)
RBC: 4.33 MIL/uL (ref 3.87–5.11)
RDW: 12.5 % (ref 11.5–15.5)
WBC: 12.5 10*3/uL — ABNORMAL HIGH (ref 4.0–10.5)

## 2014-10-25 LAB — POC URINE PREG, ED: PREG TEST UR: NEGATIVE

## 2014-10-25 LAB — URINALYSIS, ROUTINE W REFLEX MICROSCOPIC
Bilirubin Urine: NEGATIVE
GLUCOSE, UA: NEGATIVE mg/dL
KETONES UR: NEGATIVE mg/dL
Nitrite: NEGATIVE
Protein, ur: >300 mg/dL — AB
Specific Gravity, Urine: 1.026 (ref 1.005–1.030)
Urobilinogen, UA: 0.2 mg/dL (ref 0.0–1.0)
pH: 6 (ref 5.0–8.0)

## 2014-10-25 LAB — LIPASE, BLOOD: LIPASE: 27 U/L (ref 22–51)

## 2014-10-25 MED ORDER — MORPHINE SULFATE 4 MG/ML IJ SOLN
4.0000 mg | Freq: Once | INTRAMUSCULAR | Status: AC
  Administered 2014-10-25: 4 mg via INTRAVENOUS
  Filled 2014-10-25: qty 1

## 2014-10-25 MED ORDER — CEPHALEXIN 500 MG PO CAPS: 500.0000 mg | ORAL_CAPSULE | Freq: Four times a day (QID) | ORAL | Status: DC

## 2014-10-25 MED ORDER — SODIUM CHLORIDE 0.9 % IV BOLUS (SEPSIS)
1000.0000 mL | INTRAVENOUS | Status: AC
  Administered 2014-10-25: 1000 mL via INTRAVENOUS

## 2014-10-25 MED ORDER — KETOROLAC TROMETHAMINE 30 MG/ML IJ SOLN
30.0000 mg | INTRAMUSCULAR | Status: AC
  Administered 2014-10-25: 30 mg via INTRAVENOUS
  Filled 2014-10-25: qty 1

## 2014-10-25 MED ORDER — PHENAZOPYRIDINE HCL 200 MG PO TABS: 200.0000 mg | ORAL_TABLET | Freq: Three times a day (TID) | ORAL | Status: DC

## 2014-10-25 MED ORDER — CEFTRIAXONE SODIUM 1 G IJ SOLR
1.0000 g | Freq: Once | INTRAMUSCULAR | Status: AC
  Administered 2014-10-25: 1 g via INTRAVENOUS
  Filled 2014-10-25: qty 10

## 2014-10-25 MED ORDER — ONDANSETRON HCL 4 MG/2ML IJ SOLN
4.0000 mg | INTRAMUSCULAR | Status: AC
  Administered 2014-10-25: 4 mg via INTRAVENOUS
  Filled 2014-10-25: qty 2

## 2014-10-25 NOTE — ED Provider Notes (Signed)
CSN: [id number]     Arrival date & time 10/24/14  2311 History   First MD Initiated Contact with Patient 10/25/14 [phone]     Chief Complaint  Patient presents with  . Abdominal Pain  . Nausea   (Consider location/radiation/quality/duration/timing/severity/associated sxs/prior Treatment) HPI  Alisha Wall is a 24 year old female presenting with right-sided abdominal pain. She states her pain began tonight while she was at work around 7 PM. She describes the pain came on very strong, and rates it as a 10/10. The pain has subsided after taking Tylenol. Her pain now is a 6/10. She reports a history of a right-sided ovarian cyst and a urinary tract infection last year. She states this abdominal pain feels similar to the UTI last year, and reports she did not have dysuria with that diagnosis. She does report some intermittent vaginal odor but no significant change in vaginal discharge. She reports some nausea tonight but denies any vomiting, fevers, chills, diarrhea or dysuria.  Past Medical History  Diagnosis Date  . UTI (lower urinary tract infection)   . No pertinent past medical history   . Depression   . Anxiety    Past Surgical History  Procedure Laterality Date  . No past surgeries     No family history on file. History  Substance Use Topics  . Smoking status: Never Smoker   . Smokeless tobacco: Never Used  . Alcohol Use: No   OB History    Gravida Para Term Preterm AB TAB SAB Ectopic Multiple Living   5 2 2  2 1 1   2      Review of Systems  Constitutional: Negative for fever and chills.  HENT: Negative for sore throat.   Eyes: Negative for visual disturbance.  Respiratory: Negative for cough and shortness of breath.   Cardiovascular: Negative for chest pain and leg swelling.  Gastrointestinal: Positive for nausea and abdominal pain. Negative for vomiting and diarrhea.  Genitourinary: Positive for urgency and frequency. Negative for dysuria, vaginal discharge and vaginal  pain.  Musculoskeletal: Negative for myalgias.  Skin: Negative for rash.  Neurological: Negative for weakness, numbness and headaches.      Allergies  Review of patient's allergies indicates no known allergies.  Home Medications   Prior to Admission medications   Medication Sig Start Date End Date Taking? Authorizing Provider  acetaminophen (TYLENOL) 500 MG tablet Take 1,000 mg by mouth every 6 (six) hours as needed for mild pain.   Yes Historical Provider, MD  ibuprofen (ADVIL,MOTRIN) 200 MG tablet Take 400 mg by mouth every 6 (six) hours as needed for moderate pain.   Yes Historical Provider, MD  dextromethorphan-guaiFENesin (MUCINEX DM) 30-600 MG per 12 hr tablet Take 1 tablet by mouth 2 (two) times daily. Patient not taking: Reported on 10/25/2014 09/04/14   Vanetta MuldersScott Zackowski, MD  naproxen (NAPROSYN) 500 MG tablet Take 1 tablet (500 mg total) by mouth 2 (two) times daily. Patient not taking: Reported on 10/25/2014 09/04/14   Vanetta MuldersScott Zackowski, MD  promethazine (PHENERGAN) 25 MG tablet Take 1 tablet (25 mg total) by mouth every 6 (six) hours as needed for nausea or vomiting. Patient not taking: Reported on 10/25/2014 09/04/14   Vanetta MuldersScott Zackowski, MD   BP 96/63 mmHg  Pulse 105  Temp(Src) 99.9 F (37.7 C) (Oral)  Resp 16  Ht 5\' 5"  (1.651 m)  Wt 155 lb (70.308 kg)  BMI 25.79 kg/m2  SpO2 100%  LMP 09/30/2014 Physical Exam  Constitutional: She appears well-developed and well-nourished.  No distress.  HENT:  Head: Normocephalic and atraumatic.  Mouth/Throat: Oropharynx is clear and moist. No oropharyngeal exudate.  Eyes: Conjunctivae are normal.  Neck: Neck supple. No thyromegaly present.  Cardiovascular: Normal rate, regular rhythm and intact distal pulses.   Pulmonary/Chest: Effort normal and breath sounds normal. No respiratory distress. She has no wheezes. She has no rales. She exhibits no tenderness.  Abdominal: Soft. There is tenderness. There is CVA tenderness. There is no rigidity, no  rebound, no guarding, no tenderness at McBurney's point and negative Murphy's sign.    Mild CVA tenderness  Musculoskeletal: She exhibits no tenderness.  Lymphadenopathy:    She has no cervical adenopathy.  Neurological: She is alert.  Skin: Skin is warm and dry. No rash noted. She is not diaphoretic.  Psychiatric: She has a normal mood and affect.  Nursing note and vitals reviewed.   ED Course  Procedures (including critical care time) Labs Review Labs Reviewed  WET PREP, GENITAL - Abnormal; Notable for the following:    Clue Cells Wet Prep HPF POC FEW (*)    WBC, Wet Prep HPF POC RARE (*)    All other components within normal limits  CBC WITH DIFFERENTIAL/PLATELET - Abnormal; Notable for the following:    WBC 12.5 (*)    Neutrophils Relative % 84 (*)    Neutro Abs 10.6 (*)    Lymphocytes Relative 8 (*)    All other components within normal limits  COMPREHENSIVE METABOLIC PANEL - Abnormal; Notable for the following:    Glucose, Bld 105 (*)    All other components within normal limits  URINALYSIS, ROUTINE W REFLEX MICROSCOPIC - Abnormal; Notable for the following:    APPearance TURBID (*)    Hgb urine dipstick LARGE (*)    Protein, ur >300 (*)    Leukocytes, UA LARGE (*)    All other components within normal limits  URINE CULTURE  LIPASE, BLOOD  URINE MICROSCOPIC-ADD ON  POC URINE PREG, ED  POC URINE PREG, ED  GC/CHLAMYDIA PROBE AMP ([place])    Imaging Review No results found.   EKG Interpretation None      MDM   Final diagnoses:  UTI (lower urinary tract infection)  Pyelonephritis   24 yo with significant infection in urine with frequency and urgency.  She has mild CVA tenderness so she will be treated for pyelonephritis. She was treated and symptoms resolved in the ED with NS bolus and IV rocephin. Her pelvic exam and wet prep were unremarkable.  Prescription for abx and pyridium provided. Pt is well-appearing, in no acute distress and vital signs  reviewed and not concerning.  She appears safe to be discharged.  Discharge include follow-up with their PCP.  Return precautions provided. Pt aware of plan and in agreement.   Filed Vitals:   10/24/14 2343 10/25/14 0210 10/25/14 0504  BP: 96/63 100/57 106/62  Pulse: 105 85   Temp: 99.9 F (37.7 C) 98.7 F (37.1 C)   TempSrc: Oral Oral   Resp: Height:  (1.651 m)    Weight: 155 lb (70.308 kg)    SpO2: 100% 99% 99%   Meds given in ED:  Medications  sodium chloride 0.9 % bolus 1,000 mL (0 mLs Intravenous Stopped 10/25/14 0504)  ondansetron (ZOFRAN) injection 4 mg (4 mg Intravenous Given 10/25/14 0311)  ketorolac (TORADOL) 30 MG/ML injection 30 mg (30 mg Intravenous Given 10/25/14 0310)  cefTRIAXone (ROCEPHIN) 1 g in dextrose 5 % 50  mL IVPB (0 g Intravenous Stopped 10/25/14 0504)  morphine 4 MG/ML injection 4 mg (4 mg Intravenous Given 10/25/14 0343)    Discharge Medication List as of 10/25/2014  4:34 AM    START taking these medications   Details  cephALEXin (KEFLEX) 500 MG capsule Take 1 capsule (500 mg total) by mouth 4 (four) times daily., Starting 10/25/2014, Until Discontinued, Print    phenazopyridine (PYRIDIUM) 200 MG tablet Take 1 tablet (200 mg total) by mouth 3 (three) times daily., Starting 10/25/2014, Until Discontinued, Print           Harle BattiestElizabeth Torria Fromer, NP 10/26/14 1726  Mirian MoMatthew Gentry, MD 10/28/14 [phone]

## 2014-10-25 NOTE — Discharge Instructions (Signed)
Please follow directions provided. Be sure to follow-up with your primary care doctor to ensure you're getting better. Be sure to drink plenty of fluids by mouth to stay well hydrated. Please take the antibiotic as directed until it's all gone. You may use the Pyridium to help with pain. Don't hesitate to return for any new, worsening, or concerning symptoms.   SEEK IMMEDIATE MEDICAL CARE IF:  You have severe back pain or lower abdominal pain.  You develop chills.  You have nausea or vomiting.  You have continued burning or discomfort with urination.

## 2014-10-25 NOTE — ED Notes (Signed)
Pt states her urine is hard to start a stream,  Pt states abdominal pain and nausea, says she has had symptoms like this before and it was a urinary tract infection

## 2014-10-26 LAB — GC/CHLAMYDIA PROBE AMP (~~LOC~~) NOT AT ARMC
CHLAMYDIA, DNA PROBE: NEGATIVE
Neisseria Gonorrhea: NEGATIVE

## 2014-10-27 LAB — OB RESULTS CONSOLE GBS: STREP GROUP B AG: POSITIVE

## 2014-10-27 LAB — OB RESULTS CONSOLE GC/CHLAMYDIA
Chlamydia: NEGATIVE
Gonorrhea: NEGATIVE

## 2014-10-27 LAB — URINE CULTURE
Colony Count: 30000
SPECIAL REQUESTS: NORMAL

## 2014-10-28 ENCOUNTER — Telehealth (HOSPITAL_BASED_OUTPATIENT_CLINIC_OR_DEPARTMENT_OTHER): Payer: Self-pay | Admitting: Emergency Medicine

## 2014-10-28 NOTE — Telephone Encounter (Signed)
Post ED Visit - Positive Culture Follow-up  Culture report reviewed by antimicrobial stewardship pharmacist: []  Wes Dulaney, Pharm.D., BCPS [x]  Celedonio MiyamotoJeremy Frens, [address] BCPS []  Georgina PillionElizabeth Martin, Pharm.D., BCPS []  EdenburgMinh Pham, [address] BCPS, AAHIVP []  Estella HuskMichelle Turner, Pharm.D., BCPS, AAHIVP []  Elder CyphersLorie Poole, [address] BCPS  Positive urine culture Group B strep Treated with cephalexin, organism sensitive to the same and no further patient follow-up is required at this time.  Berle MullMiller, Malayia Spizzirri 10/28/2014, 10:37 AM

## 2014-10-29 MED ORDER — CEPHALEXIN 500 MG PO CAPS: 500.0000 mg | ORAL_CAPSULE | Freq: Four times a day (QID) | ORAL | Status: DC

## 2014-10-29 MED ORDER — PHENAZOPYRIDINE HCL 200 MG PO TABS: 200.0000 mg | ORAL_TABLET | Freq: Three times a day (TID) | ORAL | Status: DC

## 2014-10-29 NOTE — ED Notes (Signed)
Pt's chart was accessed by this Charge RN to verify contact information.  Pt misplaced prescriptions and needed them reprinted.

## 2015-04-21 ENCOUNTER — Inpatient Hospital Stay (HOSPITAL_COMMUNITY)
Admission: AD | Admit: 2015-04-21 | Discharge: 2015-04-21 | Disposition: A | Discharge status: Home / Self Care | Payer: Medicaid Other | Source: Ambulatory Visit | Attending: Obstetrics and Gynecology | Admitting: Obstetrics and Gynecology

## 2015-04-21 ENCOUNTER — Encounter (HOSPITAL_COMMUNITY): Payer: Self-pay | Admitting: *Deleted

## 2015-04-21 DIAGNOSIS — Z3200 Encounter for pregnancy test, result unknown: Secondary | ICD-10-CM | POA: Diagnosis present

## 2015-04-21 DIAGNOSIS — Z3201 Encounter for pregnancy test, result positive: Secondary | ICD-10-CM

## 2015-04-21 NOTE — MA[facility] Provider Note (Signed)
This patient presents with request of pregnancy verification. She denies pain or bleeding. The nurse was able to doppler fetal heart tones on triage.  Her LMP dating is inconsistent however dating can be corrected with appropriate prenatal care and ultrasound is not warranted emergently.   I will give her the verification letter to take to the health department so she can start her prenatal care. She is to start prenatal vitamins. She will return here as needed for any problems.  Precautions explained and pt indicates understanding.

## 2015-04-21 NOTE — MAU Note (Signed)
Pt presents to MAU for proof of pregnancy. Denies any vaginal bleeding or abnormal discharge

## 2015-06-20 NOTE — L&D Delivery Note (Signed)
   Delivery Note At 10:54 PM a viable female was delivered via Vaginal, Spontaneous Delivery (Presentation: Left Occiput Anterior).  APGAR: 9, 9; weight 7 lb 12.2 oz (3520 g).   Placenta status: Intact, Spontaneous.  Cord: 3 vessels with the following complications: None.  Cord pH: none  Anesthesia: Epidural  Episiotomy: None Lacerations: None Suture Repair: None Est. Blood Loss (mL): 350  Mom to postpartum floor.   Baby to Couplet care / Skin to Skin.  HARPER,CHARLES A 11/27/2015, 6:33 AM

## 2015-06-24 LAB — OB RESULTS CONSOLE GC/CHLAMYDIA
Chlamydia: NEGATIVE
GC PROBE AMP, GENITAL: NEGATIVE

## 2015-06-24 LAB — OB RESULTS CONSOLE GBS: GBS: NEGATIVE

## 2015-06-24 LAB — OB RESULTS CONSOLE ANTIBODY SCREEN: ANTIBODY SCREEN: NEGATIVE

## 2015-06-24 LAB — OB RESULTS CONSOLE HEPATITIS B SURFACE ANTIGEN: Hepatitis B Surface Ag: NEGATIVE

## 2015-06-24 LAB — OB RESULTS CONSOLE RPR: RPR: NONREACTIVE

## 2015-06-24 LAB — OB RESULTS CONSOLE HIV ANTIBODY (ROUTINE TESTING): HIV: NONREACTIVE

## 2015-06-24 LAB — OB RESULTS CONSOLE RUBELLA ANTIBODY, IGM: RUBELLA: IMMUNE

## 2015-06-24 LAB — OB RESULTS CONSOLE ABO/RH: RH Type: POSITIVE

## 2015-07-09 ENCOUNTER — Encounter (HOSPITAL_COMMUNITY): Payer: Self-pay | Admitting: *Deleted

## 2015-07-09 ENCOUNTER — Inpatient Hospital Stay (HOSPITAL_COMMUNITY)
Admission: AD | Admit: 2015-07-09 | Discharge: 2015-07-09 | Disposition: A | Discharge status: Home / Self Care | Payer: Medicaid Other | Source: Ambulatory Visit | Attending: Obstetrics | Admitting: Obstetrics

## 2015-07-09 DIAGNOSIS — M545 Low back pain: Secondary | ICD-10-CM | POA: Insufficient documentation

## 2015-07-09 DIAGNOSIS — M62830 Muscle spasm of back: Secondary | ICD-10-CM | POA: Insufficient documentation

## 2015-07-09 DIAGNOSIS — O26892 Other specified pregnancy related conditions, second trimester: Secondary | ICD-10-CM | POA: Insufficient documentation

## 2015-07-09 DIAGNOSIS — M549 Dorsalgia, unspecified: Secondary | ICD-10-CM | POA: Diagnosis present

## 2015-07-09 DIAGNOSIS — F329 Major depressive disorder, single episode, unspecified: Secondary | ICD-10-CM | POA: Diagnosis not present

## 2015-07-09 DIAGNOSIS — N859 Noninflammatory disorder of uterus, unspecified: Secondary | ICD-10-CM | POA: Diagnosis not present

## 2015-07-09 DIAGNOSIS — R1030 Lower abdominal pain, unspecified: Secondary | ICD-10-CM | POA: Diagnosis not present

## 2015-07-09 DIAGNOSIS — R109 Unspecified abdominal pain: Secondary | ICD-10-CM | POA: Diagnosis present

## 2015-07-09 DIAGNOSIS — O9989 Other specified diseases and conditions complicating pregnancy, childbirth and the puerperium: Secondary | ICD-10-CM | POA: Diagnosis not present

## 2015-07-09 DIAGNOSIS — R82998 Other abnormal findings in urine: Secondary | ICD-10-CM

## 2015-07-09 DIAGNOSIS — N858 Other specified noninflammatory disorders of uterus: Secondary | ICD-10-CM

## 2015-07-09 LAB — URINE MICROSCOPIC-ADD ON

## 2015-07-09 LAB — URINALYSIS, ROUTINE W REFLEX MICROSCOPIC
Bilirubin Urine: NEGATIVE
Glucose, UA: NEGATIVE mg/dL
Ketones, ur: NEGATIVE mg/dL
Nitrite: NEGATIVE
PH: 7 (ref 5.0–8.0)
PROTEIN: NEGATIVE mg/dL
Specific Gravity, Urine: 1.015 (ref 1.005–1.030)

## 2015-07-09 MED ORDER — CEPHALEXIN 500 MG PO CAPS: 500.0000 mg | ORAL_CAPSULE | Freq: Four times a day (QID) | ORAL | Status: DC

## 2015-07-09 MED ORDER — CYCLOBENZAPRINE HCL 5 MG PO TABS
5.0000 mg | ORAL_TABLET | Freq: Once | ORAL | Status: AC
  Administered 2015-07-09: 5 mg via ORAL
  Filled 2015-07-09: qty 1

## 2015-07-09 MED ORDER — CYCLOBENZAPRINE HCL 10 MG PO TABS: 10.0000 mg | ORAL_TABLET | Freq: Three times a day (TID) | ORAL | Status: DC | PRN

## 2015-07-09 NOTE — Discharge Instructions (Signed)
Abdominal Pain During Pregnancy Abdominal pain is common in pregnancy. Most of the time, it does not cause harm. There are many causes of abdominal pain. Some causes are more serious than others. Some of the causes of abdominal pain in pregnancy are easily diagnosed. Occasionally, the diagnosis takes time to understand. Other times, the cause is not determined. Abdominal pain can be a sign that something is very wrong with the pregnancy, or the pain may have nothing to do with the pregnancy at all. For this reason, always tell your health care provider if you have any abdominal discomfort. HOME CARE INSTRUCTIONS  Monitor your abdominal pain for any changes. The following actions may help to alleviate any discomfort you are experiencing:  Do not have sexual intercourse or put anything in your vagina until your symptoms go away completely.  Get plenty of rest until your pain improves.  Drink clear fluids if you feel nauseous. Avoid solid food as long as you are uncomfortable or nauseous.  Only take over-the-counter or prescription medicine as directed by your health care provider.  Keep all follow-up appointments with your health care provider. SEEK IMMEDIATE MEDICAL CARE IF:  You are bleeding, leaking fluid, or passing tissue from the vagina.  You have increasing pain or cramping.  You have persistent vomiting.  You have painful or bloody urination.  You have a fever.  You notice a decrease in your baby's movements.  You have extreme weakness or feel faint.  You have shortness of breath, with or without abdominal pain.  You develop a severe headache with abdominal pain.  You have abnormal vaginal discharge with abdominal pain.  You have persistent diarrhea.  You have abdominal pain that continues even after rest, or gets worse. MAKE SURE YOU:   Understand these instructions.  Will watch your condition.  Will get help right away if you are not doing well or get worse.     This information is not intended to replace advice given to you by your health care provider. Make sure you discuss any questions you have with your health care provider.   Document Released: 06/05/2005 Document Revised: 03/26/2013 Document Reviewed: 01/02/2013 Elsevier Interactive Patient Education 2016 [employer] Inc[employer] Back Pain in Pregnancy Back pain during pregnancy is common. It happens in about half of all pregnancies. It is important for you and your baby that you remain active during your pregnancy.If you feel that back pain is not allowing you to remain active or sleep well, it is time to see your caregiver. Back pain may be caused by several factors related to changes during your pregnancy.Fortunately, unless you had trouble with your back before your pregnancy, the pain is likely to get better after you deliver. Low back pain usually occurs between the fifth and seventh months of pregnancy. It can, however, happen in the first couple months. Factors that increase the risk of back problems include:   Previous back problems.  Injury to your back.  Having twins or multiple births.  A chronic cough.  Stress.  Job-related repetitive motions.  Muscle or spinal disease in the back.  Family history of back problems, ruptured (herniated) discs, or osteoporosis.  Depression, anxiety, and panic attacks. CAUSES   When you are pregnant, your body produces a hormone called relaxin. This hormonemakes the ligaments connecting the low back and pubic bones more flexible. This flexibility allows the baby to be delivered more easily. When your ligaments are loose, your muscles need to work harder to support your  back. Soreness in your back can come from tired muscles. Soreness can also come from back tissues that are irritated since they are receiving less support. °· As the baby grows, it puts pressure on the nerves and blood vessels in your pelvis. This can cause back pain. °· As the baby  grows and gets heavier during pregnancy, the uterus pushes the stomach muscles forward and changes your center of gravity. This makes your back muscles work harder to maintain good posture. °SYMPTOMS  °Lumbar pain during pregnancy °Lumbar pain during pregnancy usually occurs at or above the waist in the center of the back. There may be pain and numbness that radiates into your leg or foot. This is similar to low back pain experienced by non-pregnant women. It usually increases with sitting for long periods of time, standing, or repetitive lifting. Tenderness may also be present in the muscles along your upper back. °Posterior pelvic pain during pregnancy °Pain in the back of the pelvis is more common than lumbar pain in pregnancy. It is a deep pain felt in your side at the waistline, or across the tailbone (sacrum), or in both places. You may have pain on one or both sides. This pain can also go into the buttocks and backs of the upper thighs. Pubic and groin pain may also be present. The pain does not quickly resolve with rest, and morning stiffness may also be present. °Pelvic pain during pregnancy can be brought on by most activities. A high level of fitness before and during pregnancy may or may not prevent this problem. Labor pain is usually 1 to 2 minutes apart, lasts for about 1 minute, and involves a bearing down feeling or pressure in your pelvis. However, if you are at term with the pregnancy, constant low back pain can be the beginning of early labor, and you should be aware of this. °DIAGNOSIS  °X-rays of the back should not be done during the first 12 to 14 weeks of the pregnancy and only when absolutely necessary during the rest of the pregnancy. MRIs do not give off radiation and are safe during pregnancy. MRIs also should only be done when absolutely necessary. °HOME CARE INSTRUCTIONS °· Exercise as directed by your caregiver. Exercise is the most effective way to prevent or manage back pain. If you  have a back problem, it is especially important to avoid sports that require sudden body movements. Swimming and walking are great activities. °· Do not stand in one place for long periods of time. °· Do not wear high heels. °· Sit in chairs with good posture. Use a pillow on your lower back if necessary. Make sure your head rests over your shoulders and is not hanging forward. °· Try sleeping on your side, preferably the left side, with a pillow or two between your legs. If you are sore after a night's rest, your bed may be too soft. Try placing a board between your mattress and box spring. °· Listen to your body when lifting. If you are experiencing pain, ask for help or try bending your knees more so you can use your leg muscles rather than your back muscles. Squat down when picking up something from the floor. Do not bend over. °· Eat a healthy diet. Try to gain weight within your caregiver's recommendations. °· Use heat or cold packs 3 to 4 times a day for 15 minutes to help with the pain. °· Only take over-the-counter or prescription medicines for pain, discomfort, or fever as directed by your caregiver. °  Sudden (acute) back pain  Use bed rest for only the most extreme, acute episodes of back pain. Prolonged bed rest over 48 hours will aggravate your condition.  Ice is very effective for acute conditions.  Put ice in a plastic bag.  Place a towel between your skin and the bag.  Leave the ice on for 10 to 20 minutes every 2 hours, or as needed.  Using heat packs for 30 minutes prior to activities is also helpful. Continued back pain See your caregiver if you have continued problems. Your caregiver can help or refer you for appropriate physical therapy. With conditioning, most back problems can be avoided. Sometimes, a more serious issue may be the cause of back pain. You should be seen right away if new problems seem to be developing. Your caregiver may recommend:  A maternity girdle.  An  elastic sling.  A back brace.  A massage therapist or acupuncture. SEEK MEDICAL CARE IF:   You are not able to do most of your daily activities, even when taking the pain medicine you were given.  You need a referral to a physical therapist or chiropractor.  You want to try acupuncture. SEEK IMMEDIATE MEDICAL CARE IF:  You develop numbness, tingling, weakness, or problems with the use of your arms or legs.  You develop severe back pain that is no longer relieved with medicines.  You have a sudden change in bowel or bladder control.  You have increasing pain in other areas of the body.  You develop shortness of breath, dizziness, or fainting.  You develop nausea, vomiting, or sweating.  You have back pain which is similar to labor pains.  You have back pain along with your water breaking or vaginal bleeding.  You have back pain or numbness that travels down your leg.  Your back pain developed after you fell.  You develop pain on one side of your back. You may have a kidney stone.  You see blood in your urine. You may have a bladder infection or kidney stone.  You have back pain with blisters. You may have shingles. Back pain is fairly common during pregnancy but should not be accepted as just part of the process. Back pain should always be treated as soon as possible. This will make your pregnancy as pleasant as possible.   This information is not intended to replace advice given to you by your health care provider. Make sure you discuss any questions you have with your health care provider.   Document Released: 09/13/2005 Document Revised: 08/28/2011 Document Reviewed: 10/25/2010 Elsevier Interactive Patient Education 2016 Elsevier Inc. Round Ligament Pain The round ligament is a cord of muscle and tissue that helps to support the uterus. It can become a source of pain during pregnancy if it becomes stretched or twisted as the baby grows. The pain usually begins in the  second trimester of pregnancy, and it can come and go until the baby is delivered. It is not a serious problem, and it does not cause harm to the baby. Round ligament pain is usually a short, sharp, and pinching pain, but it can also be a dull, lingering, and aching pain. The pain is felt in the lower side of the abdomen or in the groin. It usually starts deep in the groin and moves up to the outside of the hip area. Pain can occur with:  A sudden change in position.  Rolling over in bed.  Coughing or sneezing.  Physical activity. HOME  CARE INSTRUCTIONS Watch your condition for any changes. Take these steps to help with your pain:  When the pain starts, relax. Then try:  Sitting down.  Flexing your knees up to your abdomen.  Lying on your side with one pillow under your abdomen and another pillow between your legs.  Sitting in a warm bath for 15-20 minutes or until the pain goes away.  Take over-the-counter and prescription medicines only as told by your health care provider.  Move slowly when you sit and stand.  Avoid long walks if they cause pain.  Stop or lessen your physical activities if they cause pain. SEEK MEDICAL CARE IF:  Your pain does not go away with treatment.  You feel pain in your back that you did not have before.  Your medicine is not helping. SEEK IMMEDIATE MEDICAL CARE IF:  You develop a fever or chills.  You develop uterine contractions.  You develop vaginal bleeding.  You develop nausea or vomiting.  You develop diarrhea.  You have pain when you urinate.   This information is not intended to replace advice given to you by your health care provider. Make sure you discuss any questions you have with your health care provider.   Document Released: 03/14/2008 Document Revised: 08/28/2011 Document Reviewed: 08/12/2014 Elsevier Interactive Patient Education [employer]

## 2015-07-09 NOTE — MAU Note (Signed)
C/o back pain for past month; c/o abdominal pain for about a month; extra tylenol is not relieving either pain;

## 2015-07-09 NOTE — MAU Provider Note (Signed)
Chief Complaint:  Back Pain and Abdominal Pain   First Provider Initiated Contact with Patient 07/09/15 1635     HPI  Alisha Wall is a 25 y.o. [id number] at [id number] presents to maternity admissions reporting lower abdominal pain for several weeks and low back pain for 2 months. Has told Dr Gaynell Face about this and states he told her if the abdominal pain comes back to come in and get medicine to stop the contractions..States abdominal pain comes and goes, sometimes tightens.  Hurts at top (epigastric) and on sides and bottom.    Back pain is worse with movement, interrupts sleep at night. Has never had evaluation by orthopedics.  She reports good fetal movement, denies LOF, vaginal bleeding, vaginal itching/burning, urinary symptoms, h/a, dizziness, n/v, diarrhea, constipation or fever/chills.  She denies headache, visual changes or abdominal pain.  RN Note:  Expand All Collapse All   C/o back pain for past month; c/o abdominal pain for about a month; extra tylenol is not relieving either pain;            Past Medical History: Past Medical History  Diagnosis Date  . UTI (lower urinary tract infection)   . No pertinent past medical history   . Depression   . Anxiety     Past obstetric history: OB History  Gravida Para Term Preterm AB SAB TAB Ectopic Multiple Living  # Outcome Date GA Lbr Len/2nd Weight Sex Delivery Anes PTL Lv  5 Current           4 Term 12/22/11    Genella Mech EPI  Y  3 Term 11/25/10    Genella Mech EPI  Y  2 TAB 2011          1 SAB               Past Surgical History: Past Surgical History  Procedure Laterality Date  . Induced abortion      Family History: Family History  Problem Relation Age of Onset  . Alcohol abuse Neg Hx   . Arthritis Neg Hx   . Asthma Neg Hx   . Cancer Neg Hx   . Birth defects Neg Hx   . COPD Neg Hx   . Depression Neg Hx   . Diabetes Neg Hx   . Drug abuse Neg Hx   . Early death Neg Hx   .  Hearing loss Neg Hx   . Heart disease Neg Hx   . Hyperlipidemia Neg Hx   . Hypertension Neg Hx   . Kidney disease Neg Hx   . Learning disabilities Neg Hx   . Mental illness Neg Hx   . Mental retardation Neg Hx   . Miscarriages / Stillbirths Neg Hx   . Stroke Neg Hx   . Vision loss Neg Hx   . Varicose Veins Neg Hx     Social History: Social History  Substance Use Topics  . Smoking status: Never Smoker   . Smokeless tobacco: Never Used  . Alcohol Use: No    Allergies: No Known Allergies  Meds:  Prescriptions prior to admission  Medication Sig Dispense Refill Last Dose  . acetaminophen (TYLENOL) 500 MG tablet Take 1,000 mg by mouth every 6 (six) hours as needed for mild pain.   10/24/2014 at Unknown time  . cephALEXin (KEFLEX) 500 MG capsule Take 1 capsule (500 mg total) by mouth 4 (four) times  daily. 20 capsule 0   . dextromethorphan-guaiFENesin (MUCINEX DM) 30-600 MG per 12 hr tablet Take 1 tablet by mouth 2 (two) times daily. (Patient not taking: Reported on 10/25/2014) 14 tablet 0 Completed Course at Unknown time  . ibuprofen (ADVIL,MOTRIN) 200 MG tablet Take 400 mg by mouth every 6 (six) hours as needed for moderate pain.   10/24/2014 at Unknown time  . naproxen (NAPROSYN) 500 MG tablet Take 1 tablet (500 mg total) by mouth 2 (two) times daily. (Patient not taking: Reported on 10/25/2014) 14 tablet 0 Completed Course at Unknown time  . phenazopyridine (PYRIDIUM) 200 MG tablet Take 1 tablet (200 mg total) by mouth 3 (three) times daily. 6 tablet 0   . promethazine (PHENERGAN) 25 MG tablet Take 1 tablet (25 mg total) by mouth every 6 (six) hours as needed for nausea or vomiting. (Patient not taking: Reported on 10/25/2014) 12 tablet 1 Completed Course at Unknown time    I have reviewed patient's Past Medical Hx, Surgical Hx, Family Hx, Social Hx, medications and allergies.   ROS:  Review of Systems  Constitutional: Negative for fever, chills and activity change.  Respiratory: Negative  for shortness of breath.   Gastrointestinal: Positive for abdominal pain. Negative for nausea, vomiting, diarrhea, constipation and abdominal distention.  Genitourinary: Positive for flank pain and pelvic pain. Negative for dysuria, frequency, vaginal bleeding, vaginal discharge and difficulty urinating.  Musculoskeletal: Positive for back pain. Negative for myalgias.  Neurological: Negative for weakness and numbness.     Physical Exam  Patient Vitals for the past 24 hrs:  BP Temp Temp src Pulse Resp  07/09/15 1615 101/62 mmHg 97.8 F (36.6 C) Oral 88 16   Constitutional: Well-developed, well-nourished female in no acute distress.  Cardiovascular: normal rate and rhythm Respiratory: normal effort, clear to auscultation bilaterally GI: Abd soft, mildly tender throughout, moreso over bilateral round ligaments, gravid appropriate for gestational age.   No rebound or guarding. MS: Extremities nontender, no edema, normal ROM             Side bends + elicit pain and spasm. Forward bend does also. Neurologic: Alert and oriented x 4.  GU: Neg CVAT.  PELVIC EXAM: Cervix firm, posterior, neg CMT, uterus nontender, Fundal Height consistent with dates, adnexa without tenderness, enlargement, or mass  Dilation: Closed Effacement (%): Thick Station:  (high) Exam by:: Artelia Laroche CNM  Toco applied   Labs: Results for orders placed or performed during the hospital encounter of 07/09/15 (from the past 24 hour(s))  Urinalysis, Routine w reflex microscopic (not at [facility])     Status: Abnormal   Collection Time: 07/09/15  4:00 PM  Result Value Ref Range   Color, Urine YELLOW YELLOW   APPearance CLEAR CLEAR   Specific Gravity, Urine 1.015 1.005 - 1.030   pH 7.0 5.0 - 8.0   Glucose, UA NEGATIVE NEGATIVE mg/dL   Hgb urine dipstick TRACE (A) NEGATIVE   Bilirubin Urine NEGATIVE NEGATIVE   Ketones, ur NEGATIVE NEGATIVE mg/dL   Protein, ur NEGATIVE NEGATIVE mg/dL   Nitrite NEGATIVE NEGATIVE    Leukocytes, UA LARGE (A) NEGATIVE  Urine microscopic-add on     Status: Abnormal   Collection Time: 07/09/15  4:00 PM  Result Value Ref Range   Squamous Epithelial / LPF 6-30 (A) NONE SEEN   WBC, UA 6-30 0 - 5 WBC/hpf   RBC / HPF 0-5 0 - 5 RBC/hpf   Bacteria, UA MANY (A) NONE SEEN   Urine-Other MUCOUS PRESENT  Imaging:  No results found.  MAU Course/MDM: I have ordered labs and reviewed results.  Toco tracing reviewed    Irritability seen on one segment then went away     Treatments in MAU included Flexeril for back pain to see if it helps a little. Half dose given. If works, will rx full dose for PRN use at home.   Advised she may have UTI.Marland Kitchen Urine to culture.  Pt stable at time of discharge.   Assessment: [facility]UP at [redacted]w[redacted]d  Lower abdominal pain, uterine irritability with no change in cervix Low back pain, + muscle spasm  Plan: Discharge home Preterm Labor precautions and fetal kick counts Will Rx Keflex for UTI and send urine to culture Will Rx Flexeril for back pain. Will have Dr Gaynell Face refer her to Ortho Follow up in Office for prenatal visits and recheck of UA    Medication List    ASK your doctor about these medications        acetaminophen 500 MG tablet  Commonly known as:  TYLENOL  Take 1,000 mg by mouth every 6 (six) hours as needed for mild pain.     cephALEXin 500 MG capsule  Commonly known as:  KEFLEX  Take 1 capsule (500 mg total) by mouth 4 (four) times daily.     dextromethorphan-guaiFENesin 30-600 MG 12hr tablet  Commonly known as:  MUCINEX DM  Take 1 tablet by mouth 2 (two) times daily.     ibuprofen 200 MG tablet  Commonly known as:  ADVIL,MOTRIN  Take 400 mg by mouth every 6 (six) hours as needed for moderate pain.     naproxen 500 MG tablet  Commonly known as:  NAPROSYN  Take 1 tablet (500 mg total) by mouth 2 (two) times daily.     phenazopyridine 200 MG tablet  Commonly known as:  PYRIDIUM  Take 1 tablet (200 mg total) by mouth 3  (three) times daily.     promethazine 25 MG tablet  Commonly known as:  PHENERGAN  Take 1 tablet (25 mg total) by mouth every 6 (six) hours as needed for nausea or vomiting.        Wynelle Bourgeois CNM, MSN Certified Nurse-Midwife 07/09/2015 4:48 PM

## 2015-07-12 LAB — CYTOLOGY - PAP: Pap Smear: NEGATIVE

## 2015-07-20 IMAGING — CR DG CHEST 2V
2 series · 2 of 2 positions shown · non-contrast
Comparison: None.

CLINICAL DATA: Central chest pain for 5 months.  Initial encounter.

EXAM:
CHEST  2 VIEW

[w chest pa]
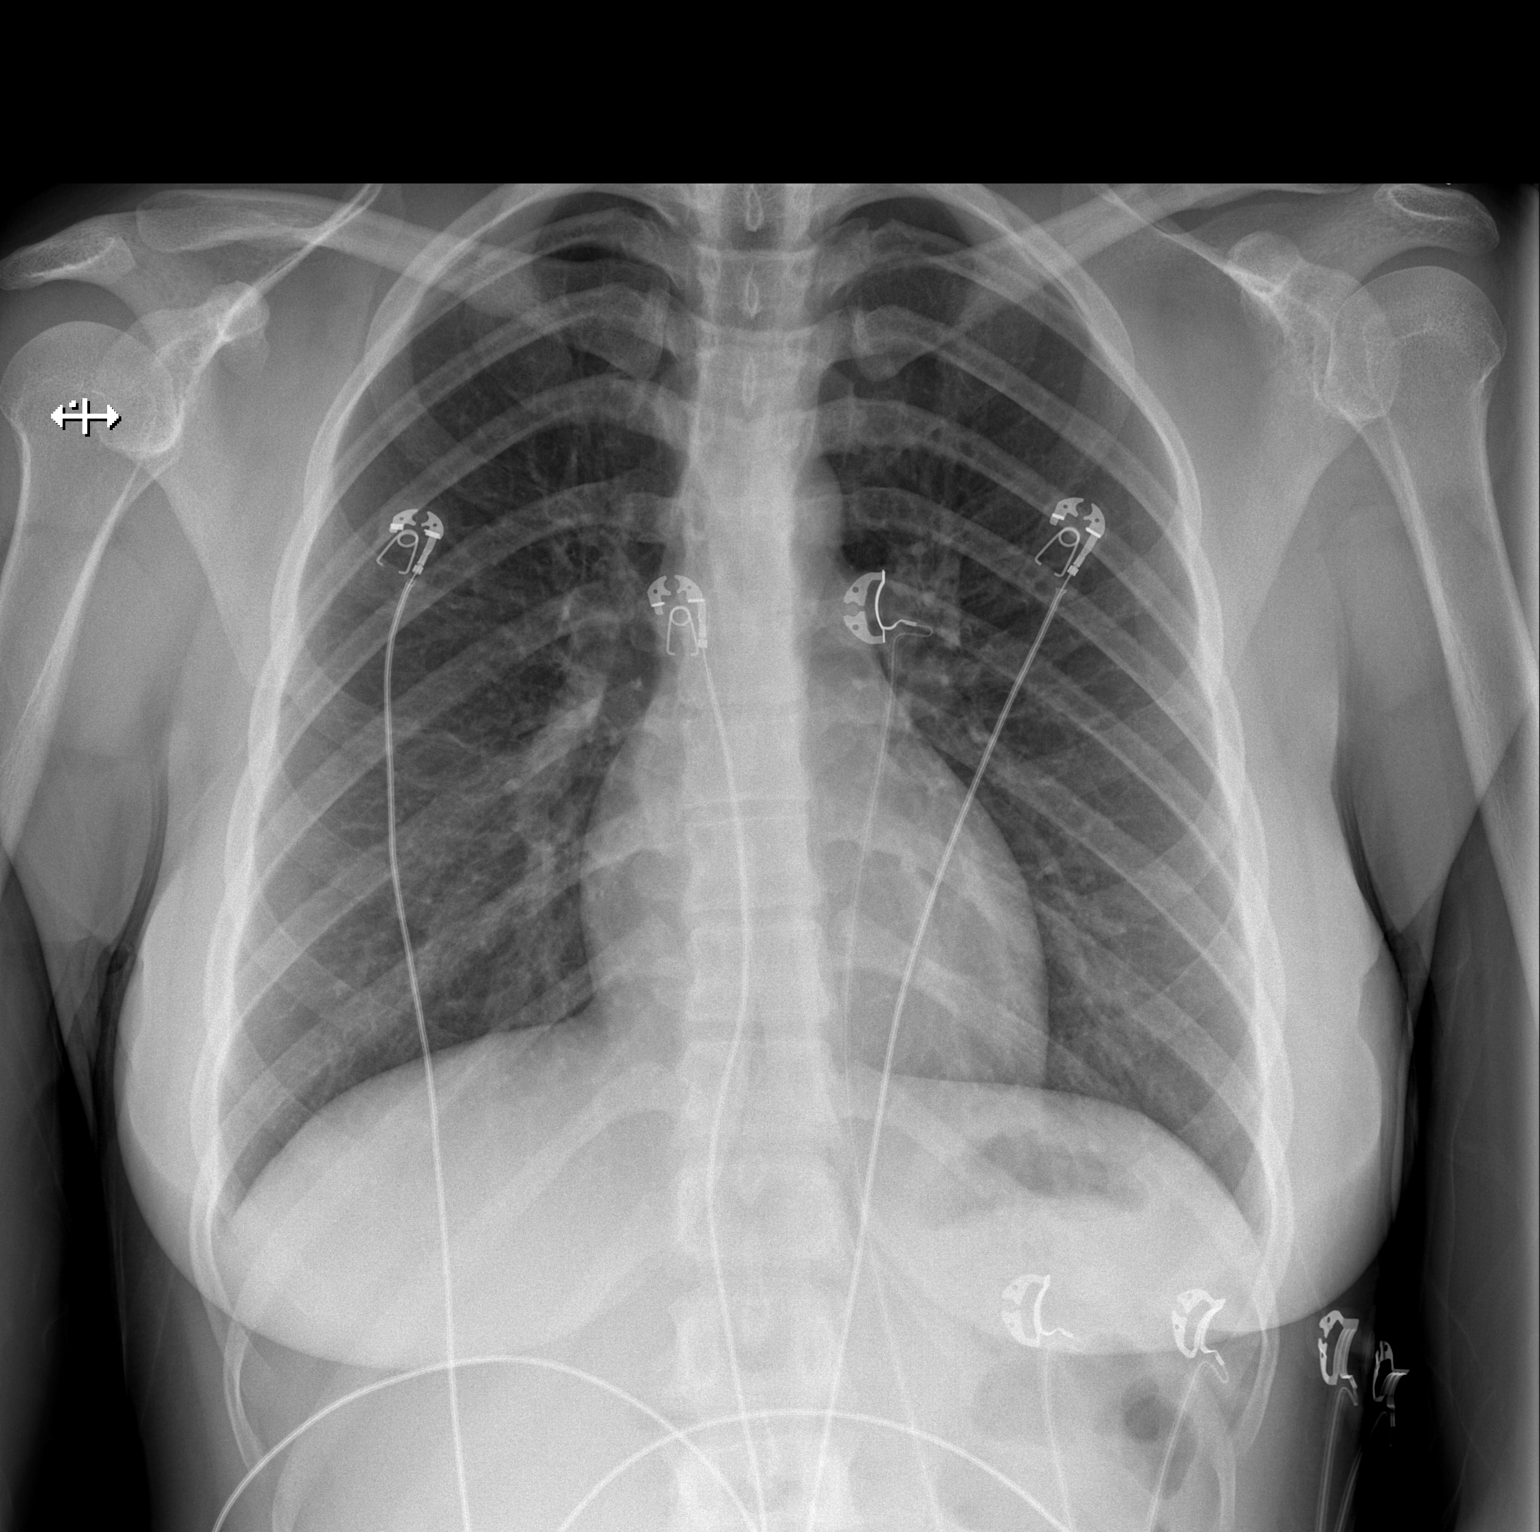

[w chest lat]
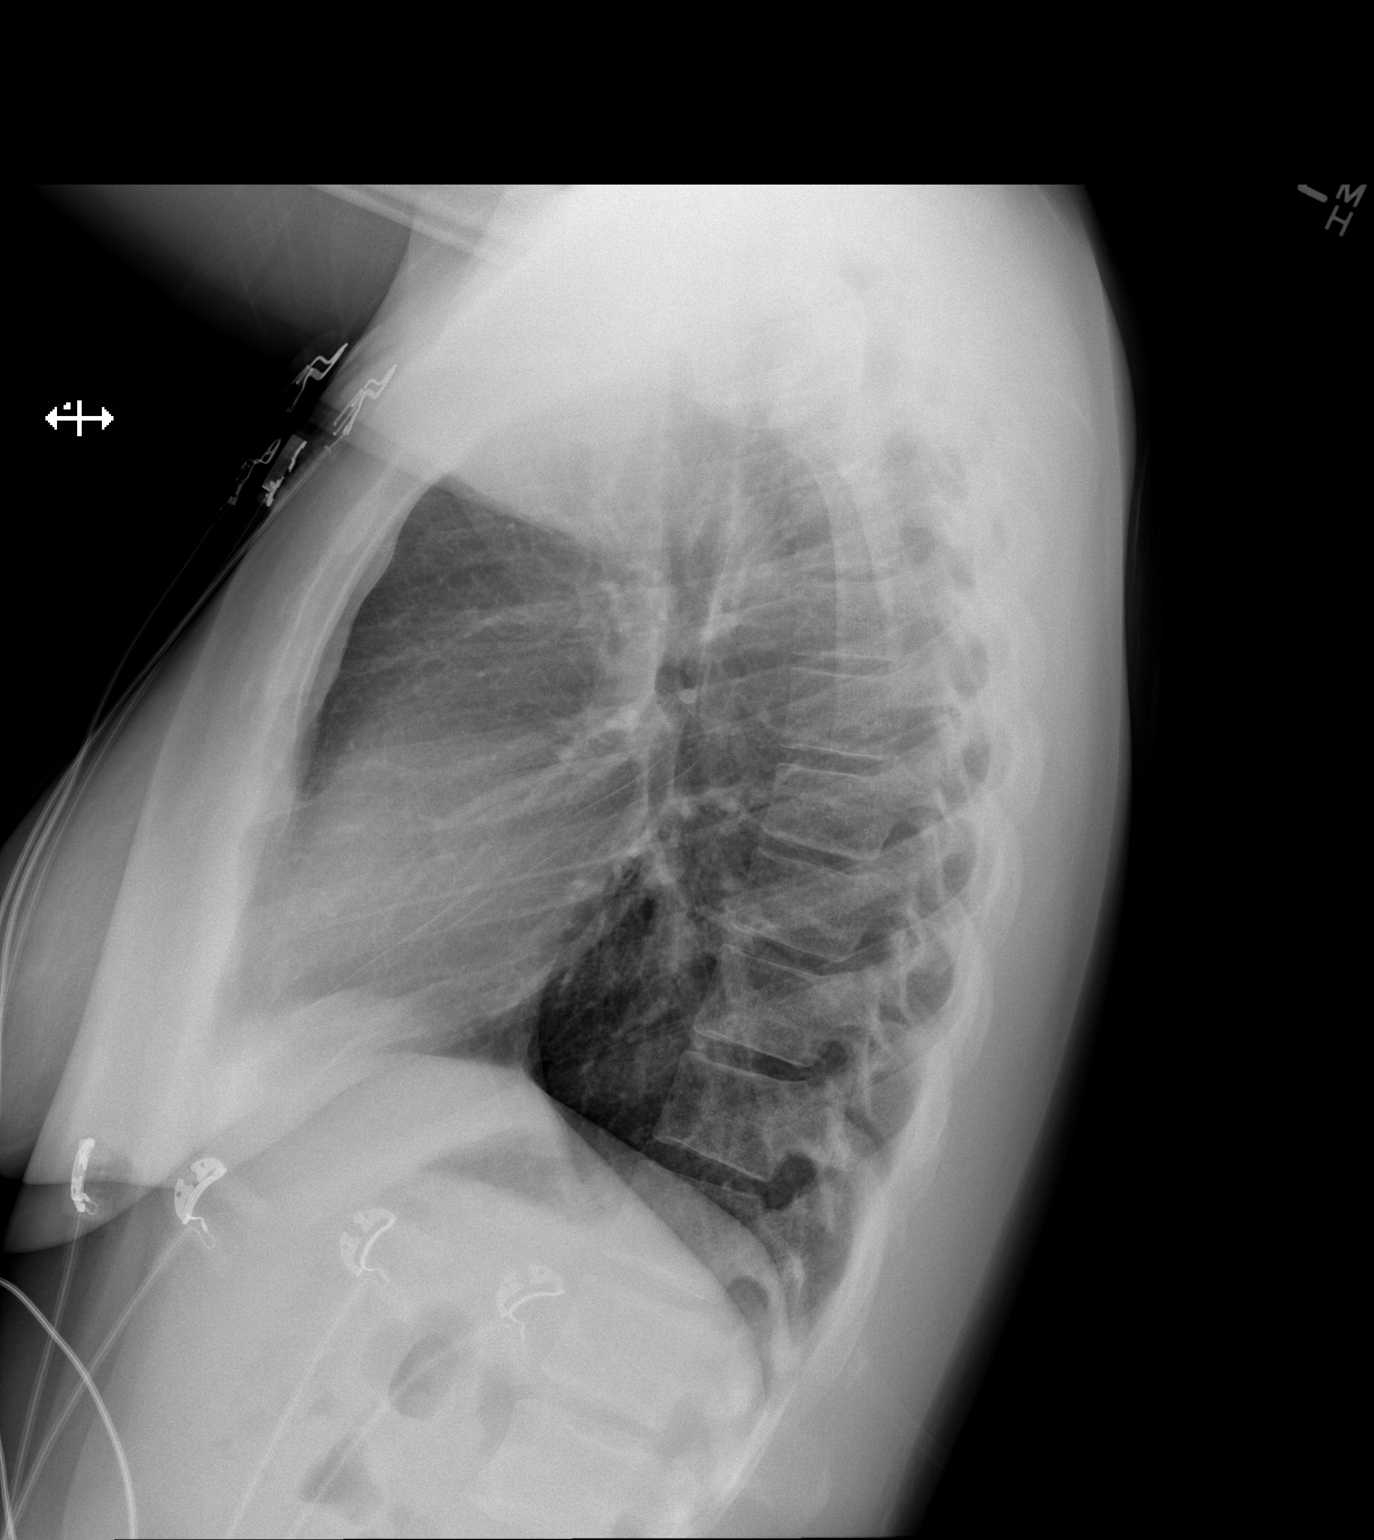

[2 of 2 positions shown; findings below may reference images not displayed]

FINDINGS: Cardiopericardial silhouette within normal limits. Mediastinal
contours normal. Trachea midline. No airspace disease or effusion.
Monitoring leads project over the chest.
IMPRESSION: No active cardiopulmonary disease.

## 2015-08-10 ENCOUNTER — Encounter (HOSPITAL_COMMUNITY): Payer: Self-pay | Admitting: *Deleted

## 2015-08-10 ENCOUNTER — Inpatient Hospital Stay (HOSPITAL_COMMUNITY)
Admission: AD | Admit: 2015-08-10 | Discharge: 2015-08-10 | Disposition: A | Discharge status: Home / Self Care | Payer: Medicaid Other | Source: Ambulatory Visit | Attending: Obstetrics | Admitting: Obstetrics

## 2015-08-10 DIAGNOSIS — O2312 Infections of bladder in pregnancy, second trimester: Secondary | ICD-10-CM

## 2015-08-10 DIAGNOSIS — R109 Unspecified abdominal pain: Secondary | ICD-10-CM | POA: Diagnosis present

## 2015-08-10 DIAGNOSIS — Z3A24 24 weeks gestation of pregnancy: Secondary | ICD-10-CM | POA: Insufficient documentation

## 2015-08-10 DIAGNOSIS — Z87891 Personal history of nicotine dependence: Secondary | ICD-10-CM | POA: Diagnosis not present

## 2015-08-10 DIAGNOSIS — F329 Major depressive disorder, single episode, unspecified: Secondary | ICD-10-CM | POA: Insufficient documentation

## 2015-08-10 DIAGNOSIS — N3090 Cystitis, unspecified without hematuria: Secondary | ICD-10-CM | POA: Diagnosis not present

## 2015-08-10 DIAGNOSIS — O2310 Infections of bladder in pregnancy, unspecified trimester: Secondary | ICD-10-CM | POA: Insufficient documentation

## 2015-08-10 LAB — COMPREHENSIVE METABOLIC PANEL
ALK PHOS: 86 U/L (ref 38–126)
ALT: 17 U/L (ref 14–54)
AST: 20 U/L (ref 15–41)
Albumin: 3.4 g/dL — ABNORMAL LOW (ref 3.5–5.0)
Anion gap: 9 (ref 5–15)
BILIRUBIN TOTAL: 1 mg/dL (ref 0.3–1.2)
BUN: 6 mg/dL (ref 6–20)
CALCIUM: 8.5 mg/dL — AB (ref 8.9–10.3)
CO2: 22 mmol/L (ref 22–32)
Chloride: 106 mmol/L (ref 101–111)
Creatinine, Ser: 0.48 mg/dL (ref 0.44–1.00)
GFR calc non Af Amer: >60 mL/min (ref >60)
Glucose, Bld: 86 mg/dL (ref 65–99)
Potassium: 3.5 mmol/L (ref 3.5–5.1)
SODIUM: 137 mmol/L (ref 135–145)
TOTAL PROTEIN: 7.4 g/dL (ref 6.5–8.1)

## 2015-08-10 LAB — URINALYSIS, ROUTINE W REFLEX MICROSCOPIC
Bilirubin Urine: NEGATIVE
GLUCOSE, UA: NEGATIVE mg/dL
Ketones, ur: 40 mg/dL — AB
NITRITE: NEGATIVE
Protein, ur: 30 mg/dL — AB
Specific Gravity, Urine: 1.02 (ref 1.005–1.030)
pH: 6.5 (ref 5.0–8.0)

## 2015-08-10 LAB — CBC
HCT: 31.2 % — ABNORMAL LOW (ref 36.0–46.0)
Hemoglobin: 10.7 g/dL — ABNORMAL LOW (ref 12.0–15.0)
MCH: 29.6 pg (ref 26.0–34.0)
MCHC: 34.3 g/dL (ref 30.0–36.0)
MCV: 86.4 fL (ref 78.0–100.0)
Platelets: 325 10*3/uL (ref 150–400)
RBC: 3.61 MIL/uL — ABNORMAL LOW (ref 3.87–5.11)
RDW: 13.3 % (ref 11.5–15.5)
WBC: 12.4 10*3/uL — ABNORMAL HIGH (ref 4.0–10.5)

## 2015-08-10 LAB — URINE MICROSCOPIC-ADD ON

## 2015-08-10 MED ORDER — CEPHALEXIN 500 MG PO CAPS: 500.0000 mg | ORAL_CAPSULE | Freq: Four times a day (QID) | ORAL | Status: DC

## 2015-08-10 MED ORDER — ACETAMINOPHEN-CODEINE #3 300-30 MG PO TABS: 1.0000 | ORAL_TABLET | ORAL | Status: DC | PRN

## 2015-08-10 MED ORDER — SODIUM CHLORIDE 0.9 % IV SOLN
INTRAVENOUS | Status: DC
  Administered 2015-08-10: 16:00:00 via INTRAVENOUS

## 2015-08-10 MED ORDER — DEXTROSE 5 % IV SOLN
2.0000 g | Freq: Once | INTRAVENOUS | Status: AC
  Administered 2015-08-10: 2 g via INTRAVENOUS
  Filled 2015-08-10: qty 2

## 2015-08-10 MED ORDER — ONDANSETRON 8 MG PO TBDP: 8.0000 mg | ORAL_TABLET | Freq: Three times a day (TID) | ORAL | Status: DC | PRN

## 2015-08-10 NOTE — Discharge Instructions (Signed)
Pregnancy and Urinary Tract Infection  A urinary tract infection (UTI) is a bacterial infection of the urinary tract. Infection of the urinary tract can include the ureters, kidneys (pyelonephritis), bladder (cystitis), and urethra (urethritis). All pregnant women should be screened for bacteria in the urinary tract. Identifying and treating a UTI will decrease the risk of preterm labor and developing more serious infections in both the mother and baby.  CAUSES  Bacteria germs cause almost all UTIs.   RISK FACTORS  Many factors can increase your chances of getting a UTI during pregnancy. These include:  · Having a short urethra.  · Poor toilet and hygiene habits.  · Sexual intercourse.  · Blockage of urine along the urinary tract.  · Problems with the pelvic muscles or nerves.  · Diabetes.  · Obesity.  · Bladder problems after having several children.  · Previous history of UTI.  SIGNS AND SYMPTOMS   · Pain, burning, or a stinging feeling when urinating.  · Suddenly feeling the need to urinate right away (urgency).  · Loss of bladder control (urinary incontinence).  · Frequent urination, more than is common with pregnancy.  · Lower abdominal or back discomfort.  · Cloudy urine.  · Blood in the urine (hematuria).  · Fever.   When the kidneys are infected, the symptoms may be:  · Back pain.  · Flank pain on the right side more so than the left.  · Fever.  · Chills.  · Nausea.  · Vomiting.  DIAGNOSIS   A urinary tract infection is usually diagnosed through urine tests. Additional tests and procedures are sometimes done. These may include:  · Ultrasound exam of the kidneys, ureters, bladder, and urethra.  · Looking in the bladder with a lighted tube (cystoscopy).  TREATMENT  Typically, UTIs can be treated with antibiotic medicines.   HOME CARE INSTRUCTIONS   · Only take over-the-counter or prescription medicines as directed by your health care provider. If you were prescribed antibiotics, take them as directed. Finish  them even if you start to feel better.  · Drink enough fluids to keep your urine clear or pale yellow.  · Do not have sexual intercourse until the infection is gone and your health care provider says it is okay.  · Make sure you are tested for UTIs throughout your pregnancy. These infections often come back.   Preventing a UTI in the Future  · Practice good toilet habits. Always wipe from front to back. Use the tissue only once.  · Do not hold your urine. Empty your bladder as soon as possible when the urge comes.  · Do not douche or use deodorant sprays.  · Wash with soap and warm water around the genital area and the anus.  · Empty your bladder before and after sexual intercourse.  · Wear underwear with a cotton crotch.  · Avoid caffeine and carbonated drinks. They can irritate the bladder.  · Drink cranberry juice or take cranberry pills. This may decrease the risk of getting a UTI.  · Do not drink alcohol.  · Keep all your appointments and tests as scheduled.   SEEK MEDICAL CARE IF:   · Your symptoms get worse.  · You are still having fevers 2 or more days after treatment begins.  · You have a rash.  · You feel that you are having problems with medicines prescribed.  · You have abnormal vaginal discharge.  SEEK IMMEDIATE MEDICAL CARE IF:   · You have back or flank   pain.  · You have chills.  · You have blood in your urine.  · You have nausea and vomiting.  · You have contractions of your uterus.  · You have a gush of fluid from the vagina.  MAKE SURE YOU:  · Understand these instructions.    · Will watch your condition.    · Will get help right away if you are not doing well or get worse.       This information is not intended to replace advice given to you by your health care provider. Make sure you discuss any questions you have with your health care provider.     Document Released: 09/30/2010 Document Revised: 03/26/2013 Document Reviewed: 01/02/2013  Elsevier Interactive Patient Education ©2016 Elsevier  Inc.

## 2015-08-10 NOTE — MAU Provider Note (Signed)
History     CSN: [id number]  Arrival date and time: 08/10/15 1315   First Provider Initiated Contact with Patient 08/10/15 1432        Chief Complaint  Patient presents with  . Abdominal Pain  . urinary pressure    HPI  Alisha Wall is a 25 y.o. [id number] at [redacted]w[redacted]d who presents for abdominal pain.  Patient reports right sided abdominal pain radiating to the right flank for the past day. She describes it as a sharp, cramping pain that is a 10/10 severity. The pain is associated with urinary frequency, nausea, vomiting, dark urine, diarrhea and headaches. She has not taken her temperature but reports feeling constantly hot. Pain aggravated by movement. Has taken tylenol with no relief. She denies burning on urination but does admit to feeling pressure and "the need to push" when she urinates. Previous medical history significant for a suspected bladder infection 1 month ago for which she was prescribed Keflex. She says she took her course or antibiotics as prescribed.   OB History    Gravida Para Term Preterm AB TAB SAB Ectopic Multiple Living   Past Medical History  Diagnosis Date  . UTI (lower urinary tract infection)   . No pertinent past medical history   . Depression   . Anxiety     Past Surgical History  Procedure Laterality Date  . Induced abortion      Family History  Problem Relation Age of Onset  . Alcohol abuse Neg Hx   . Arthritis Neg Hx   . Asthma Neg Hx   . Cancer Neg Hx   . Birth defects Neg Hx   . COPD Neg Hx   . Depression Neg Hx   . Diabetes Neg Hx   . Drug abuse Neg Hx   . Early death Neg Hx   . Hearing loss Neg Hx   . Heart disease Neg Hx   . Hyperlipidemia Neg Hx   . Hypertension Neg Hx   . Kidney disease Neg Hx   . Learning disabilities Neg Hx   . Mental illness Neg Hx   . Mental retardation Neg Hx   . Miscarriages / Stillbirths Neg Hx   . Stroke Neg Hx   . Vision loss Neg Hx   . Varicose Veins Neg Hx      Social History  Substance Use Topics  . Smoking status: Former Games developer  . Smokeless tobacco: Never Used  . Alcohol Use: No    Allergies: No Known Allergies  Prescriptions prior to admission  Medication Sig Dispense Refill Last Dose  . acetaminophen (TYLENOL) 500 MG tablet Take 1,000 mg by mouth every 6 (six) hours as needed for mild pain.   08/10/2015 at Unknown time  . Prenatal Vit-Fe Fumarate-FA (PRENATAL MULTIVITAMIN) TABS tablet Take 1 tablet by mouth daily at 12 noon.   Past Week at Unknown time  . cephALEXin (KEFLEX) 500 MG capsule Take 1 capsule (500 mg total) by mouth 4 (four) times daily. (Patient not taking: Reported on 08/10/2015) 28 capsule 0 Not Taking at Unknown time  . cyclobenzaprine (FLEXERIL) 10 MG tablet Take 1 tablet (10 mg total) by mouth 3 (three) times daily as needed for muscle spasms. (Patient not taking: Reported on 08/10/2015) 30 tablet 0 Not Taking at Unknown time    Review of Systems  Constitutional: Negative for fever and chills.  Gastrointestinal: Positive for nausea,  vomiting, abdominal pain and diarrhea. Negative for constipation.  Genitourinary: Positive for urgency, frequency and flank pain. Negative for dysuria and hematuria.   Physical Exam   Blood pressure 103/61, pulse 103, temperature 98.8 F (37.1 C), temperature source Oral, resp. rate 18, last menstrual period 03/01/2015.  Physical Exam  Nursing note and vitals reviewed. Constitutional: She is oriented to person, place, and time. She appears well-developed and well-nourished. She appears distressed.  HENT:  Head: Normocephalic and atraumatic.  Eyes: Conjunctivae are normal. Right eye exhibits no discharge. Left eye exhibits no discharge. No scleral icterus.  Neck: Normal range of motion.  Cardiovascular: Normal rate, regular rhythm and normal heart sounds.   No murmur heard. Respiratory: Effort normal and breath sounds normal. No respiratory distress. She has no wheezes.  GI: Soft.  There is CVA tenderness (right). There is no rigidity, no rebound and no guarding.  Neurological: She is alert and oriented to person, place, and time.  Skin: Skin is warm and dry. She is not diaphoretic.  Psychiatric: She has a normal mood and affect. Her behavior is normal. Judgment and thought content normal.   Dilation: Closed Exam by:: Judeth Horn NP  Fetal Tracing:  Baseline: 155 Variability: moderate Accelerations: 10x10 Decelerations: none  Toco: none   MAU Course  Procedures Results for orders placed or performed during the hospital encounter of 08/10/15 (from the past 24 hour(s))  Urinalysis, Routine w reflex microscopic (not at [facility])     Status: Abnormal   Collection Time: 08/10/15  1:50 PM  Result Value Ref Range   Color, Urine YELLOW YELLOW   APPearance CLOUDY (A) CLEAR   Specific Gravity, Urine 1.020 1.005 - 1.030   pH 6.5 5.0 - 8.0   Glucose, UA NEGATIVE NEGATIVE mg/dL   Hgb urine dipstick MODERATE (A) NEGATIVE   Bilirubin Urine NEGATIVE NEGATIVE   Ketones, ur 40 (A) NEGATIVE mg/dL   Protein, ur 30 (A) NEGATIVE mg/dL   Nitrite NEGATIVE NEGATIVE   Leukocytes, UA LARGE (A) NEGATIVE  Urine microscopic-add on     Status: Abnormal   Collection Time: 08/10/15  1:50 PM  Result Value Ref Range   Squamous Epithelial / LPF 0-5 (A) NONE SEEN   WBC, UA TOO NUMEROUS TO COUNT 0 - 5 WBC/hpf   RBC / HPF 0-5 0 - 5 RBC/hpf   Bacteria, UA FEW (A) NONE SEEN  CBC     Status: Abnormal   Collection Time: 08/10/15  3:17 PM  Result Value Ref Range   WBC 12.4 (H) 4.0 - 10.5 K/uL   RBC 3.61 (L) 3.87 - 5.11 MIL/uL   Hemoglobin 10.7 (L) 12.0 - 15.0 g/dL   HCT 40.9 (L) 81.1 - 91.4 %   MCV 86.4 78.0 - 100.0 fL   MCH 29.6 26.0 - 34.0 pg   MCHC 34.3 30.0 - 36.0 g/dL   RDW 78.2 95.6 - 21.3 %   Platelets 325 150 - 400 K/uL  Comprehensive metabolic panel     Status: Abnormal   Collection Time: 08/10/15  3:17 PM  Result Value Ref Range   Sodium 137 135 - 145 mmol/L   Potassium  3.5 3.5 - 5.1 mmol/L   Chloride 106 101 - 111 mmol/L   CO2 22 22 - 32 mmol/L   Glucose, Bld 86 65 - 99 mg/dL   BUN 6 6 - 20 mg/dL   Creatinine, Ser 0.86 0.44 - 1.00 mg/dL   Calcium 8.5 (L) 8.9 - 10.3 mg/dL   Total Protein  7.4 6.5 - 8.1 g/dL   Albumin 3.4 (L) 3.5 - 5.0 g/dL   AST 20 15 - 41 U/L   ALT 17 14 - 54 U/L   Alkaline Phosphatase 86 38 - 126 U/L   Total Bilirubin 1.0 0.3 - 1.2 mg/dL   GFR calc non Af Amer >60 >60 mL/min   GFR calc Af Amer >60 >60 mL/min   Anion gap 9 5 - 15    MDM S/w Dr. Gaynell Face. Pt to be discharged with keflex & tylenol #3 Pt concerned that she will not be able to tolerate PO abx; will give dose of rocephin IV in MAU & discharge with antiemetic for PO abx.   Assessment and Plan  A: 1. Cystitis of pregnancy in second trimester     P: Discharge home Rx keflex, tylenol #3, & zofran Urine culture pending Discussed reasons to return to MAU  Judeth Horn 08/10/2015, 2:32 PM

## 2015-08-10 NOTE — MAU Note (Addendum)
Severe constant pain in R side of abdomen upper & lower - started @ 2100 last night, has extreme pressure with urination.  Has pain with walking.  Unsure if having uc's, denies bleeding or LOF.  Noted light pink discharge with wiping this morning.  Urine is dark & concentrated.  Pt also C/O diarrhea that started around 0200, vomited once this a.m.

## 2015-08-11 ENCOUNTER — Other Ambulatory Visit (HOSPITAL_COMMUNITY): Payer: Self-pay | Admitting: Obstetrics

## 2015-08-11 ENCOUNTER — Encounter (HOSPITAL_COMMUNITY): Payer: Self-pay | Admitting: Obstetrics

## 2015-08-11 DIAGNOSIS — Z3A25 25 weeks gestation of pregnancy: Secondary | ICD-10-CM

## 2015-08-11 DIAGNOSIS — O283 Abnormal ultrasonic finding on antenatal screening of mother: Secondary | ICD-10-CM

## 2015-08-11 DIAGNOSIS — Z3689 Encounter for other specified antenatal screening: Secondary | ICD-10-CM

## 2015-08-12 LAB — CULTURE, OB URINE

## 2015-08-18 ENCOUNTER — Encounter (HOSPITAL_COMMUNITY): Payer: Self-pay

## 2015-08-18 ENCOUNTER — Ambulatory Visit (HOSPITAL_COMMUNITY)
Admit: 2015-08-18 | Discharge: 2015-08-18 | Disposition: A | Discharge status: Home / Self Care | Payer: Medicaid Other | Attending: Obstetrics | Admitting: Obstetrics

## 2015-08-18 DIAGNOSIS — Z36 Encounter for antenatal screening of mother: Secondary | ICD-10-CM | POA: Insufficient documentation

## 2015-08-18 DIAGNOSIS — O358XX0 Maternal care for other (suspected) fetal abnormality and damage, not applicable or unspecified: Secondary | ICD-10-CM | POA: Diagnosis not present

## 2015-08-18 DIAGNOSIS — O283 Abnormal ultrasonic finding on antenatal screening of mother: Secondary | ICD-10-CM

## 2015-08-18 DIAGNOSIS — Z3A25 25 weeks gestation of pregnancy: Secondary | ICD-10-CM | POA: Insufficient documentation

## 2015-08-18 DIAGNOSIS — Z3689 Encounter for other specified antenatal screening: Secondary | ICD-10-CM

## 2015-08-27 ENCOUNTER — Encounter (HOSPITAL_COMMUNITY): Payer: Self-pay

## 2015-11-25 ENCOUNTER — Other Ambulatory Visit: Payer: Self-pay | Admitting: Obstetrics

## 2015-11-26 ENCOUNTER — Telehealth (HOSPITAL_COMMUNITY): Payer: Self-pay | Admitting: *Deleted

## 2015-11-26 ENCOUNTER — Inpatient Hospital Stay (HOSPITAL_COMMUNITY): Payer: Medicaid Other | Admitting: Anesthesiology

## 2015-11-26 ENCOUNTER — Encounter (HOSPITAL_COMMUNITY): Payer: Self-pay

## 2015-11-26 ENCOUNTER — Inpatient Hospital Stay (HOSPITAL_COMMUNITY)
Admission: AD | Admit: 2015-11-26 | Discharge: 2015-11-28 | DRG: 775 | Disposition: A | Discharge status: Home / Self Care | Payer: Medicaid Other | Source: Ambulatory Visit | Attending: Obstetrics | Admitting: Obstetrics

## 2015-11-26 DIAGNOSIS — Z87891 Personal history of nicotine dependence: Secondary | ICD-10-CM | POA: Diagnosis not present

## 2015-11-26 DIAGNOSIS — Z3A40 40 weeks gestation of pregnancy: Secondary | ICD-10-CM | POA: Diagnosis not present

## 2015-11-26 DIAGNOSIS — F418 Other specified anxiety disorders: Secondary | ICD-10-CM | POA: Diagnosis present

## 2015-11-26 DIAGNOSIS — O99344 Other mental disorders complicating childbirth: Principal | ICD-10-CM | POA: Diagnosis present

## 2015-11-26 DIAGNOSIS — IMO0001 Reserved for inherently not codable concepts without codable children: Secondary | ICD-10-CM

## 2015-11-26 LAB — POCT FERN TEST: POCT Fern Test: NEGATIVE

## 2015-11-26 LAB — CBC
HCT: 30.9 % — ABNORMAL LOW (ref 36.0–46.0)
HEMOGLOBIN: 10.3 g/dL — AB (ref 12.0–15.0)
MCH: 26.8 pg (ref 26.0–34.0)
MCHC: 33.3 g/dL (ref 30.0–36.0)
MCV: 80.3 fL (ref 78.0–100.0)
Platelets: 293 10*3/uL (ref 150–400)
RBC: 3.85 MIL/uL — AB (ref 3.87–5.11)
RDW: 15.6 % — ABNORMAL HIGH (ref 11.5–15.5)
WBC: 7.9 10*3/uL (ref 4.0–10.5)

## 2015-11-26 LAB — TYPE AND SCREEN
ABO/RH(D): AB POS
Antibody Screen: NEGATIVE

## 2015-11-26 LAB — ABO/RH: ABO/RH(D): AB POS

## 2015-11-26 MED ORDER — OXYTOCIN 40 UNITS IN LACTATED RINGERS INFUSION - SIMPLE MED
1.0000 m[IU]/min | INTRAVENOUS | Status: DC
  Administered 2015-11-26: 2 m[IU]/min via INTRAVENOUS

## 2015-11-26 MED ORDER — NALBUPHINE HCL 10 MG/ML IJ SOLN
10.0000 mg | Freq: Once | INTRAMUSCULAR | Status: AC
  Administered 2015-11-26: 10 mg via INTRAVENOUS
  Filled 2015-11-26: qty 1

## 2015-11-26 MED ORDER — FENTANYL CITRATE (PF) 100 MCG/2ML IJ SOLN
INTRAMUSCULAR | Status: AC
  Administered 2015-11-26: 21:00:00
  Filled 2015-11-26: qty 2

## 2015-11-26 MED ORDER — FENTANYL CITRATE (PF) 100 MCG/2ML IJ SOLN
INTRAMUSCULAR | Status: DC | PRN
  Administered 2015-11-26 (×2): 50 ug via EPIDURAL

## 2015-11-26 MED ORDER — EPHEDRINE 5 MG/ML INJ: 10.0000 mg | INTRAVENOUS | Status: DC | PRN

## 2015-11-26 MED ORDER — DIPHENHYDRAMINE HCL 50 MG/ML IJ SOLN
12.5000 mg | INTRAMUSCULAR | Status: AC | PRN
  Administered 2015-11-26 (×3): 12.5 mg via INTRAVENOUS
  Filled 2015-11-26: qty 1

## 2015-11-26 MED ORDER — PHENYLEPHRINE 40 MCG/ML (10ML) SYRINGE FOR IV PUSH (FOR BLOOD PRESSURE SUPPORT): 80.0000 ug | PREFILLED_SYRINGE | INTRAVENOUS | Status: DC | PRN

## 2015-11-26 MED ORDER — FENTANYL 2.5 MCG/ML BUPIVACAINE 1/10 % EPIDURAL INFUSION (WH - ANES)
14.0000 mL/h | INTRAMUSCULAR | Status: DC | PRN
  Administered 2015-11-26 (×2): 14 mL/h via EPIDURAL
  Filled 2015-11-26 (×2): qty 125

## 2015-11-26 MED ORDER — EPHEDRINE 5 MG/ML INJ
10.0000 mg | INTRAVENOUS | Status: DC | PRN
  Filled 2015-11-26: qty 2

## 2015-11-26 MED ORDER — OXYCODONE-ACETAMINOPHEN 5-325 MG PO TABS
2.0000 | ORAL_TABLET | Freq: Once | ORAL | Status: AC
  Administered 2015-11-26: 2 via ORAL
  Filled 2015-11-26: qty 2

## 2015-11-26 MED ORDER — TERBUTALINE SULFATE 1 MG/ML IJ SOLN
0.2500 mg | Freq: Once | INTRAMUSCULAR | Status: DC | PRN
  Filled 2015-11-26: qty 1

## 2015-11-26 MED ORDER — OXYTOCIN BOLUS FROM INFUSION
500.0000 mL | INTRAVENOUS | Status: DC
  Administered 2015-11-26: 500 mL via INTRAVENOUS

## 2015-11-26 MED ORDER — ONDANSETRON HCL 4 MG/2ML IJ SOLN: 4.0000 mg | Freq: Four times a day (QID) | INTRAMUSCULAR | Status: DC | PRN

## 2015-11-26 MED ORDER — LIDOCAINE HCL (PF) 1 % IJ SOLN
INTRAMUSCULAR | Status: DC | PRN
  Administered 2015-11-26: 8 mL via EPIDURAL
  Administered 2015-11-26: 6 mL via EPIDURAL

## 2015-11-26 MED ORDER — LACTATED RINGERS IV SOLN: 500.0000 mL | Freq: Once | INTRAVENOUS | Status: DC

## 2015-11-26 MED ORDER — FLEET ENEMA 7-19 GM/118ML RE ENEM: 1.0000 | ENEMA | RECTAL | Status: DC | PRN

## 2015-11-26 MED ORDER — LACTATED RINGERS IV SOLN
INTRAVENOUS | Status: DC
  Administered 2015-11-26 (×3): via INTRAVENOUS

## 2015-11-26 MED ORDER — SOD CITRATE-CITRIC ACID 500-334 MG/5ML PO SOLN: 30.0000 mL | ORAL | Status: DC | PRN

## 2015-11-26 MED ORDER — OXYCODONE-ACETAMINOPHEN 5-325 MG PO TABS: 2.0000 | ORAL_TABLET | ORAL | Status: DC | PRN

## 2015-11-26 MED ORDER — OXYCODONE-ACETAMINOPHEN 5-325 MG PO TABS: 1.0000 | ORAL_TABLET | ORAL | Status: DC | PRN

## 2015-11-26 MED ORDER — DIPHENHYDRAMINE HCL 50 MG/ML IJ SOLN: 12.5000 mg | INTRAMUSCULAR | Status: DC | PRN

## 2015-11-26 MED ORDER — LACTATED RINGERS IV SOLN
500.0000 mL | INTRAVENOUS | Status: DC | PRN
  Administered 2015-11-26: 500 mL via INTRAVENOUS

## 2015-11-26 MED ORDER — OXYTOCIN 40 UNITS IN LACTATED RINGERS INFUSION - SIMPLE MED
2.5000 [IU]/h | INTRAVENOUS | Status: DC
  Filled 2015-11-26 (×2): qty 1000

## 2015-11-26 MED ORDER — LIDOCAINE HCL (PF) 1 % IJ SOLN
30.0000 mL | INTRAMUSCULAR | Status: DC | PRN
  Filled 2015-11-26: qty 30

## 2015-11-26 MED ORDER — BUPIVACAINE HCL (PF) 0.25 % IJ SOLN
INTRAMUSCULAR | Status: DC | PRN
  Administered 2015-11-26 (×2): 4 mL via EPIDURAL

## 2015-11-26 MED ORDER — PHENYLEPHRINE 40 MCG/ML (10ML) SYRINGE FOR IV PUSH (FOR BLOOD PRESSURE SUPPORT)
80.0000 ug | PREFILLED_SYRINGE | INTRAVENOUS | Status: DC | PRN
  Filled 2015-11-26: qty 5
  Filled 2015-11-26: qty 10

## 2015-11-26 MED ORDER — FENTANYL 2.5 MCG/ML BUPIVACAINE 1/10 % EPIDURAL INFUSION (WH - ANES): 14.0000 mL/h | INTRAMUSCULAR | Status: DC | PRN

## 2015-11-26 MED ORDER — ACETAMINOPHEN 325 MG PO TABS: 650.0000 mg | ORAL_TABLET | ORAL | Status: DC | PRN

## 2015-11-26 MED ORDER — PHENYLEPHRINE 40 MCG/ML (10ML) SYRINGE FOR IV PUSH (FOR BLOOD PRESSURE SUPPORT)
80.0000 ug | PREFILLED_SYRINGE | INTRAVENOUS | Status: DC | PRN
  Administered 2015-11-26 (×2): 80 ug via INTRAVENOUS
  Filled 2015-11-26: qty 10
  Filled 2015-11-26: qty 5

## 2015-11-26 NOTE — H&P (Signed)
Alisha Wall is a 25 y.o. female presenting for UC's. Maternal Medical History:  Reason for admission: Contractions.   Contractions: Onset was 6-12 hours ago.   Frequency: regular.   Perceived severity is moderate.    Fetal activity: Perceived fetal activity is normal.   Last perceived fetal movement was within the past hour.    Prenatal complications: no prenatal complications Prenatal Complications - Diabetes: none.    OB History    Gravida Para Term Preterm AB TAB SAB Ectopic Multiple Living   5 2 2  2 1 1   2      Past Medical History  Diagnosis Date  . UTI (lower urinary tract infection)   . Depression   . Anxiety    Past Surgical History  Procedure Laterality Date  . Induced abortion     Family History: family history is negative for Alcohol abuse, Arthritis, Asthma, Cancer, Birth defects, COPD, Depression, Diabetes, Drug abuse, Early death, Hearing loss, Heart disease, Hyperlipidemia, Hypertension, Kidney disease, Learning disabilities, Mental illness, Mental retardation, Miscarriages / Stillbirths, Stroke, Vision loss, and Varicose Veins. Social History:  reports that she has quit smoking. She has never used smokeless tobacco. She reports that she does not drink alcohol or use illicit drugs.   Prenatal Transfer Tool  Maternal Diabetes: No Genetic Screening: Normal Maternal Ultrasounds/Referrals: Normal Fetal Ultrasounds or other Referrals:  None Maternal Substance Abuse:  No Significant Maternal Medications:  None Significant Maternal Lab Results:  None Other Comments:  None  Review of Systems  All other systems reviewed and are negative.   Dilation: 5 Effacement (%): 70 Station: -2 Exam by:: Bell[employer]Cheryl RN Blood pressure 112/77, pulse 98, temperature 97.8 F (36.6 C), temperature source Oral, resp. rate 18, height 5\' 6"  (1.676 m), weight 165 lb (74.844 kg), last menstrual period 03/01/2015. Maternal Exam:  Uterine Assessment: Contraction strength is  moderate.  Contraction frequency is regular.   Abdomen: Patient reports no abdominal tenderness. Fetal presentation: vertex  Introitus: Normal vulva. Normal vagina.    Physical Exam  Nursing note and vitals reviewed. Constitutional: She is oriented to person, place, and time. She appears well-developed and well-nourished.  HENT:  Head: Normocephalic and atraumatic.  Eyes: Conjunctivae are normal. Pupils are equal, round, and reactive to light.  Neck: Normal range of motion. Neck supple.  Cardiovascular: Normal rate and regular rhythm.   Respiratory: Effort normal and breath sounds normal.  GI: Soft. Bowel sounds are normal.  Genitourinary: Vagina normal and uterus normal.  Musculoskeletal: Normal range of motion.  Neurological: She is alert and oriented to person, place, and time.  Skin: Skin is warm and dry.  Psychiatric: She has a normal mood and affect. Her behavior is normal. Judgment and thought content normal.    Prenatal labs: ABO, Rh: AB/Positive/-- (01/05 0000) Antibody: Negative (01/05 0000) Rubella: Immune (01/05 0000) RPR: Nonreactive (01/05 0000)  HBsAg: Negative (01/05 0000)  HIV: Non-reactive (01/05 0000)  GBS: Negative (01/05 0000)   Assessment/Plan: 40 weeks.  Active labor.  Admit.   HARPER,CHARLES A 11/26/2015, 1:53 PM

## 2015-11-26 NOTE — MAU Note (Signed)
Contractions started at 0900 and have been getting closer together.

## 2015-11-26 NOTE — Anesthesia Procedure Notes (Signed)
Epidural Patient location during procedure: OB Start time: 11/26/2015 3:24 PM End time: 11/26/2015 3:28 PM  Staffing Anesthesiologist: Leilani AbleHATCHETT, Carollee Nussbaumer Performed by: anesthesiologist   Preanesthetic Checklist Completed: patient identified, surgical consent, pre-op evaluation, timeout performed, IV checked, risks and benefits discussed and monitors and equipment checked  Epidural Patient position: sitting Prep: site prepped and draped and DuraPrep Patient monitoring: continuous pulse ox and blood pressure Approach: midline Location: L3-L4 Injection technique: LOR air  Needle:  Needle type: Tuohy  Needle gauge: 17 G Needle length: 9 cm and 9 Needle insertion depth: 5 cm cm Catheter type: closed end flexible Catheter size: 19 Gauge Catheter at skin depth: 10 cm Test dose: negative and Other  Assessment Sensory level: T10 Events: blood not aspirated, injection not painful, no injection resistance, negative IV test and no paresthesia  Additional Notes Reason for block:procedure for pain

## 2015-11-26 NOTE — Progress Notes (Signed)
Alisha Wall is a 25 y.o. [id number] at 5036w0d by LMP admitted for active labor  Subjective:   Objective: BP 117/83 mmHg  Pulse 104  Temp(Src) 97.7 F (36.5 C) (Oral)  Resp 20  Ht 5\' 6"  (1.676 m)  Wt 165 lb (74.844 kg)  BMI 26.64 kg/m2  SpO2 100%  LMP 03/01/2015      FHT:  FHR: 130 bpm, variability: moderate,  accelerations:  Present,  decelerations:  Present variable UC:   regular, every 2-3 minutes SVE:   Dilation: Lip/rim Effacement (%): 90 Station: 0, +1 Exam by:: L.Stubbs, RN  Labs: Lab Results  Component Value Date   WBC 7.9 11/26/2015   HGB 10.3* 11/26/2015   HCT 30.9* 11/26/2015   MCV 80.3 11/26/2015   PLT 293 11/26/2015    Assessment / Plan: Augmentation of labor, progressing well  Labor: Progressing normally Preeclampsia:  n/a Fetal Wellbeing:  Category I Pain Control:  Epidural I/D:  n/a Anticipated MOD:  NSVD  HARPER,CHARLES A 11/26/2015, 10:21 PM

## 2015-11-26 NOTE — Telephone Encounter (Signed)
Preadmission screen  

## 2015-11-26 NOTE — Anesthesia Preprocedure Evaluation (Signed)
Anesthesia Evaluation  Patient identified by MRN, date of birth, ID band Patient awake    Reviewed: Allergy & Precautions, H&P , NPO status , Patient's Chart, lab work & pertinent test results  Airway Mallampati: I  TM Distance: >3 FB Neck ROM: full    Dental no notable dental hx.    Pulmonary former smoker,    Pulmonary exam normal        Cardiovascular negative cardio ROS Normal cardiovascular exam     Neuro/Psych negative neurological ROS     GI/Hepatic negative GI ROS, Neg liver ROS,   Endo/Other  negative endocrine ROS  Renal/GU negative Renal ROS     Musculoskeletal   Abdominal Normal abdominal exam  (+)   Peds  Hematology negative hematology ROS (+)   Anesthesia Other Findings   Reproductive/Obstetrics (+) Pregnancy                             Anesthesia Physical Anesthesia Plan  ASA: II  Anesthesia Plan: Epidural   Post-op Pain Management:    Induction:   Airway Management Planned:   Additional Equipment:   Intra-op Plan:   Post-operative Plan:   Informed Consent: I have reviewed the patients History and Physical, chart, labs and discussed the procedure including the risks, benefits and alternatives for the proposed anesthesia with the patient or authorized representative who has indicated his/her understanding and acceptance.     Plan Discussed with:   Anesthesia Plan Comments:         Anesthesia Quick Evaluation  

## 2015-11-27 ENCOUNTER — Encounter (HOSPITAL_COMMUNITY): Payer: Self-pay

## 2015-11-27 LAB — RPR: RPR: NONREACTIVE

## 2015-11-27 LAB — CBC
HEMATOCRIT: 30.4 % — AB (ref 36.0–46.0)
Hemoglobin: 9.9 g/dL — ABNORMAL LOW (ref 12.0–15.0)
MCH: 26 pg (ref 26.0–34.0)
MCHC: 32.6 g/dL (ref 30.0–36.0)
MCV: 79.8 fL (ref 78.0–100.0)
PLATELETS: 267 10*3/uL (ref 150–400)
RBC: 3.81 MIL/uL — ABNORMAL LOW (ref 3.87–5.11)
RDW: 15.6 % — AB (ref 11.5–15.5)
WBC: 14.6 10*3/uL — ABNORMAL HIGH (ref 4.0–10.5)

## 2015-11-27 MED ORDER — TETANUS-DIPHTH-ACELL PERTUSSIS 5-2.5-18.5 LF-MCG/0.5 IM SUSP: 0.5000 mL | Freq: Once | INTRAMUSCULAR | Status: DC

## 2015-11-27 MED ORDER — OXYTOCIN 40 UNITS IN LACTATED RINGERS INFUSION - SIMPLE MED: 2.5000 [IU]/h | INTRAVENOUS | Status: DC | PRN

## 2015-11-27 MED ORDER — SIMETHICONE 80 MG PO CHEW: 80.0000 mg | CHEWABLE_TABLET | ORAL | Status: DC | PRN

## 2015-11-27 MED ORDER — DIBUCAINE 1 % RE OINT: 1.0000 "application " | TOPICAL_OINTMENT | RECTAL | Status: DC | PRN

## 2015-11-27 MED ORDER — WITCH HAZEL-GLYCERIN EX PADS: 1.0000 "application " | MEDICATED_PAD | CUTANEOUS | Status: DC | PRN

## 2015-11-27 MED ORDER — ZOLPIDEM TARTRATE 5 MG PO TABS: 5.0000 mg | ORAL_TABLET | Freq: Every evening | ORAL | Status: DC | PRN

## 2015-11-27 MED ORDER — ACETAMINOPHEN 325 MG PO TABS: 650.0000 mg | ORAL_TABLET | ORAL | Status: DC | PRN

## 2015-11-27 MED ORDER — MEDROXYPROGESTERONE ACETATE 150 MG/ML IM SUSP: 150.0000 mg | INTRAMUSCULAR | Status: DC | PRN

## 2015-11-27 MED ORDER — BENZOCAINE-MENTHOL 20-0.5 % EX AERO
1.0000 "application " | INHALATION_SPRAY | CUTANEOUS | Status: DC | PRN
  Administered 2015-11-27: 1 via TOPICAL
  Filled 2015-11-27: qty 56

## 2015-11-27 MED ORDER — ONDANSETRON HCL 4 MG/2ML IJ SOLN: 4.0000 mg | INTRAMUSCULAR | Status: DC | PRN

## 2015-11-27 MED ORDER — SENNOSIDES-DOCUSATE SODIUM 8.6-50 MG PO TABS
2.0000 | ORAL_TABLET | ORAL | Status: DC
  Administered 2015-11-27 – 2015-11-28 (×2): 2 via ORAL
  Filled 2015-11-27 (×2): qty 2

## 2015-11-27 MED ORDER — OXYCODONE-ACETAMINOPHEN 5-325 MG PO TABS
1.0000 | ORAL_TABLET | ORAL | Status: DC | PRN
  Administered 2015-11-27: 1 via ORAL
  Filled 2015-11-27: qty 1

## 2015-11-27 MED ORDER — DIPHENHYDRAMINE HCL 25 MG PO CAPS
25.0000 mg | ORAL_CAPSULE | Freq: Four times a day (QID) | ORAL | Status: DC | PRN
  Administered 2015-11-27: 25 mg via ORAL
  Filled 2015-11-27: qty 1

## 2015-11-27 MED ORDER — IBUPROFEN 600 MG PO TABS
600.0000 mg | ORAL_TABLET | Freq: Four times a day (QID) | ORAL | Status: DC
  Administered 2015-11-27 – 2015-11-28 (×6): 600 mg via ORAL
  Filled 2015-11-27 (×6): qty 1

## 2015-11-27 MED ORDER — ONDANSETRON HCL 4 MG PO TABS: 4.0000 mg | ORAL_TABLET | ORAL | Status: DC | PRN

## 2015-11-27 MED ORDER — OXYCODONE-ACETAMINOPHEN 5-325 MG PO TABS
2.0000 | ORAL_TABLET | ORAL | Status: DC | PRN
  Administered 2015-11-27: 2 via ORAL
  Filled 2015-11-27: qty 2

## 2015-11-27 MED ORDER — COCONUT OIL OIL: 1.0000 "application " | TOPICAL_OIL | Status: DC | PRN

## 2015-11-27 MED ORDER — PRENATAL MULTIVITAMIN CH
1.0000 | ORAL_TABLET | Freq: Every day | ORAL | Status: DC
  Administered 2015-11-27 – 2015-11-28 (×2): 1 via ORAL
  Filled 2015-11-27 (×2): qty 1

## 2015-11-27 NOTE — Progress Notes (Signed)
Post Partum Day 1 Subjective: no complaints  Objective: Blood pressure 97/64, pulse 66, temperature 97.7 F (36.5 C), temperature source Oral, resp. rate 18, height 5\' 6"  (1.676 m), weight 165 lb (74.844 kg), last menstrual period 03/01/2015, SpO2 97 %, unknown if currently breastfeeding.  Physical Exam:  General: alert and no distress Lochia: appropriate Uterine Fundus: firm Incision: none DVT Evaluation: No evidence of DVT seen on physical exam.   Recent Labs  11/26/15 1408 11/27/15 0507  HGB 10.3* 9.9*  HCT 30.9* 30.4*    Assessment/Plan: Plan for discharge tomorrow   LOS: 1 day   HARPER,CHARLES A 11/27/2015, 6:38 AM

## 2015-11-27 NOTE — Anesthesia Postprocedure Evaluation (Signed)
Anesthesia Post Note  Patient: Alisha Wall  Procedure(s) Performed: * No procedures listed *  Patient location during evaluation: Mother Baby Anesthesia Type: Epidural Level of consciousness: awake and alert Pain management: pain level controlled Vital Signs Assessment: post-procedure vital signs reviewed and stable Respiratory status: spontaneous breathing, nonlabored ventilation and respiratory function stable Cardiovascular status: stable Postop Assessment: no headache, no backache, epidural receding, patient able to bend at knees, no signs of nausea or vomiting and adequate PO intake Anesthetic complications: no     Last Vitals:  Filed Vitals:   11/27/15 0245 11/27/15 0621  BP: 103/53 97/64  Pulse: 80 66  Temp: 37.3 C 36.5 C  Resp: 18 18    Last Pain:  Filed Vitals:   11/27/15 0623  PainSc: 6    Pain Goal:                 Rica RecordsICKELTON,Daven Pinckney

## 2015-11-27 NOTE — Progress Notes (Signed)
CLINICAL SOCIAL WORK MATERNAL/CHILD NOTE  Patient Details  Name: Alisha Wall MRN: [id number] Date of Birth: 11/26/2015  Date:  11/27/2015  Clinical Social Worker Initiating Note:  Sindy Messing Date/ Time Initiated:  11/27/15/1045     Child's Name:  Alisha Wall    Legal Guardian:  Other (Comment) (Parents- Alisha Wall and Alisha Wall)   Need for Interpreter:  None   Date of Referral:  11/27/15     Reason for Referral:  Behavioral Health Issues, including SI    Referral Source:      Address:  [place], [place] [address]  Phone number:  [phone]   Household Members:  Minor Children   Natural Supports (not living in the home):  Spouse/significant other   Professional Supports: None   Employment: Full-time   Type of Work: Clinical cytogeneticist   Education:      Museum/gallery curator Resources:  [employer]   Other Resources:  [facility]   Cultural/Religious Considerations Which May Impact Care:  None reported  Strengths:  Ability to meet basic needs , Compliance with medical plan , Home prepared for child    Risk Factors/Current Problems:  None   Cognitive State:  Alert , Goal Oriented    Mood/Affect:  Relaxed    CSW Assessment: CSW received referral due to MOB history of depression and anxiety. CSW reviewed chart and met with MOB and FOB at bedside. MOB was laying in bed and FOB was attentive to baby's needs. FOB swaddled baby and sat on bed beside MOB during assessment. MOB reported some pain and requested to see RN when CSW left room. CSW alerted front desk of MOB's request.  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 MOB and FOB do not live together but FOB lives nearby and visits daily. 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 MOB's mother passed away about 2 years ago but her siblings live in [place] and maintain a good  relationship. MOB reports that home is prepared for baby with all adequate supplies and that other children are excited for him to return. MOB reports a difficult pregnancy due to sickness but stated that she was "so happy he's finally here."  MOB reports a long history of depression and anxiety dating back to when she was 7 years old. MOB was placed in a mentor program and continues to talk to her mentor currently, just not as often as when she was a child. MOB reports no formal treatment such as medication or therapy. MOB experienced postpartum depression with 62 year old son but was able to refocus her attention on work and school and never sought formal treatment. MOB feels that depression is managed currently and denies and suicidal or homicidal ideations. CSW provided education on baby blues and postpartum depression. MOB reports a negative relationship with 72 year old son's father and feels that is why she became depressed. MOB is hopeful that she can manage depression on her own since she has a strong support system currently. MOB is not interested in medications or treatment at this time. CSW provided "Feelings After Birth" brochure and encouraged attendance at support groups if desired.  MOB and FOB appreciative of visit but report no concerns at this time.   CSW Plan/Description:  No Further Intervention Required/No Barriers to Discharge    Boone Master, Pavo 11/27/2015, 11:13  AM Weekend Coverage

## 2015-11-27 NOTE — Lactation Note (Signed)
This note was copied from a baby's chart. Lactation Consultation Note: Experienced BF mom reports baby has been sleepy since delivery. Fed for a short time after delivery. Baby asleep on dad's chest. Reviewed feeding cues and encouraged to feed whenever she sees them BF brochure given Reviewed BFSG and OP appointments as resources for support after DC. To call for assist prn  Patient Name: Alisha Wall ZOXWR'UToday's Date: 11/27/2015 Reason for consult: Initial assessment   Maternal Data Formula Feeding for Exclusion: No Has patient been taught Hand Expression?: Yes Does the patient have breastfeeding experience prior to this delivery?: Yes  Feeding Feeding Type: Breast Fed Length of feed:  (few sucks. sleepy)  LATCH Score/Interventions                      Lactation Tools Discussed/Used     Consult Status Consult Status: Follow-up Date: 11/28/15 Follow-up type: In-patient    Pamelia HoitWeeks, Tenlee Wollin D 11/27/2015, 12:58 PM

## 2015-11-28 MED ORDER — IBUPROFEN 600 MG PO TABS: 600.0000 mg | ORAL_TABLET | Freq: Four times a day (QID) | ORAL | Status: DC | PRN

## 2015-11-28 MED ORDER — MEDROXYPROGESTERONE ACETATE 150 MG/ML IM SUSP: 150.0000 mg | INTRAMUSCULAR | Status: DC

## 2015-11-28 MED ORDER — OXYCODONE-ACETAMINOPHEN 5-325 MG PO TABS: 1.0000 | ORAL_TABLET | ORAL | Status: DC | PRN

## 2015-11-28 NOTE — Progress Notes (Signed)
Post Partum Day 2 Subjective: no complaints  Objective: Blood pressure 110/70, pulse 88, temperature 98.5 F (36.9 C), temperature source Oral, resp. rate 18, height 5\' 6"  (1.676 m), weight 165 lb (74.844 kg), last menstrual period 03/01/2015, SpO2 100 %, unknown if currently breastfeeding.  Physical Exam:  General: alert and no distress Lochia: appropriate Uterine Fundus: firm Incision: none DVT Evaluation: No evidence of DVT seen on physical exam. No significant calf/ankle edema.   Recent Labs  11/26/15 1408 11/27/15 0507  HGB 10.3* 9.9*  HCT 30.9* 30.4*    Assessment/Plan: Discharge home   LOS: 2 days   Alisha Wall A 11/28/2015, 7:23 AM

## 2015-11-28 NOTE — Discharge Summary (Signed)
Obstetric Discharge Summary Reason for Admission: onset of labor Prenatal Procedures: ultrasound Intrapartum Procedures: spontaneous vaginal delivery Postpartum Procedures: none Complications-Operative and Postpartum: none HEMOGLOBIN  Date Value Ref Range Status  11/27/2015 9.9* 12.0 - 15.0 g/dL Final   HCT  Date Value Ref Range Status  11/27/2015 30.4* 36.0 - 46.0 % Final    Physical Exam:  General: alert and no distress Lochia: appropriate Uterine Fundus: firm Incision: none DVT Evaluation: No evidence of DVT seen on physical exam. No significant calf/ankle edema.  Discharge Diagnoses: Term Pregnancy-delivered  Discharge Information: Date: 11/28/2015 Activity: pelvic rest Diet: routine Medications: PNV, Ibuprofen, Colace and Percocet Condition: stable Instructions: refer to practice specific booklet Discharge to: home Follow-up Information    Follow up with MARSHALL,BERNARD A, MD. Schedule an appointment as soon as possible for Wall visit in 6 weeks.   Specialty:  Obstetrics and Gynecology   Contact information:   [address] [place] [address] [phone]       Newborn Data: Live born female  Birth Weight: 7 lb 12.2 oz (3520 g) APGAR: 9, 9  Home with mother.  Alisha Wall 11/28/2015, 7:29 AM

## 2015-11-28 NOTE — Lactation Note (Signed)
This note was copied from a baby's chart. Lactation Consultation Note  Patient Name: Alisha Wall QASTM'HToday's Date: 11/28/2015 Reason for consult: Follow-up assessment;Breast/nipple pain   Follow up with exp BF mom of 37 hour old infant. Infant with 6 BF for 10-50 minutes, 4 voids and 2 stools in 24 hours prior to this assessment. Infant weight 7 lb 7.6 oz with weight loss of 4% since birth.  Mom reports she feels a little fuller today and is noticing some pinching with feeds. Mom reports he is more alert today and feeding more often and longer. Infant was cueing to feed. Mom latched him to left breast in the cross cradle hold. He was noted to latch shallowly and she did not pinching. Worked with mom to deepen latch and mom noted improvement with feeding. Discussed BF basics and enc mom to BF 8-12 x in 24 hours at first feeding cues. Colostrum was easily expressible from left breast, infant was noted to have intermittent swallows with feeding. Infant fed for 10 minutes and self detatched. Enc mom to use awakening techniques with feedings as needed to maintain feeding.   Reviewed all BF information in Taking Care of Baby and Me Booklet. Reviewed engorgement prevention/treatment, comfort pumping, and pre pumping as needed. Gave mom manual pump with instructions for use and cleaning. Reviewed I/O and enc mom to maintain feeding log and take to Ped appt. Mom to make Ped appt for Tuesday. [facility]  Reviewed LC brochure, mom aware of OP Services, BF support Groups and LC phone #. Mom with no questions at this time. Enc mom to call with questions/concerns prn.   Maternal Data Formula Feeding for Exclusion: No Has patient been taught Hand Expression?: Yes Does the patient have breastfeeding experience prior to this delivery?: Yes  Feeding Feeding Type: Breast Fed Length of feed: 10 min  LATCH Score/Interventions Latch: Grasps breast easily, tongue down, lips  flanged, rhythmical sucking. Intervention(s): Adjust position;Assist with latch;Breast massage;Breast compression  Audible Swallowing: A few with stimulation Intervention(s): Skin to skin;Hand expression;Alternate breast massage  Type of Nipple: Everted at rest and after stimulation  Comfort (Breast/Nipple): Filling, red/small blisters or bruises, mild/mod discomfort  Problem noted: Mild/Moderate discomfort Interventions (Filling): Massage;Frequent nursing Interventions  (Cracked/bleeding/bruising/blister): Expressed breast milk to nipple  Hold (Positioning): Assistance needed to correctly position infant at breast and maintain latch. Intervention(s): Breastfeeding basics reviewed;Support Pillows;Position options;Skin to skin  LATCH Score: 7  Lactation Tools Discussed/Used WIC Program: Yes Pump Review: Setup, frequency, and cleaning;Milk Storage   Consult Status Consult Status: Complete Follow-up type: Call as needed    Ed BlalockSharon S Asael Pann 11/28/2015, 12:18 PM

## 2015-12-01 ENCOUNTER — Inpatient Hospital Stay (HOSPITAL_COMMUNITY): Admission: RE | Admit: 2015-12-01 | Payer: No Typology Code available for payment source | Source: Ambulatory Visit

## 2016-02-08 ENCOUNTER — Ambulatory Visit: Payer: Self-pay | Admitting: Obstetrics

## 2016-02-24 ENCOUNTER — Encounter: Payer: Self-pay | Admitting: Obstetrics

## 2016-02-24 ENCOUNTER — Ambulatory Visit (INDEPENDENT_AMBULATORY_CARE_PROVIDER_SITE_OTHER): Payer: Medicaid Other | Admitting: Obstetrics

## 2016-02-24 DIAGNOSIS — Z30011 Encounter for initial prescription of contraceptive pills: Secondary | ICD-10-CM

## 2016-02-24 DIAGNOSIS — Z3009 Encounter for other general counseling and advice on contraception: Secondary | ICD-10-CM

## 2016-02-24 MED ORDER — NORETHINDRONE 0.35 MG PO TABS: 1.0000 | ORAL_TABLET | Freq: Every day | ORAL | 11 refills | Status: DC

## 2016-02-24 MED ORDER — OXYCODONE-ACETAMINOPHEN 5-325 MG PO TABS: 1.0000 | ORAL_TABLET | ORAL | 0 refills | Status: DC | PRN

## 2016-02-24 MED ORDER — IBUPROFEN 600 MG PO TABS: 600.0000 mg | ORAL_TABLET | Freq: Four times a day (QID) | ORAL | 5 refills | Status: DC | PRN

## 2016-02-24 NOTE — Addendum Note (Signed)
Addended by: Marya LandryFOSTER, Donavon Kimrey D on: 02/24/2016 01:54 PM   Modules accepted: Orders

## 2016-02-24 NOTE — Progress Notes (Signed)
Subjective:     Alisha Wall is a 25 y.o. female who presents for a postpartum visit. She is 3 months postpartum following a spontaneous vaginal delivery. I have fully reviewed the prenatal and intrapartum course. The delivery was at 40 gestational weeks. Outcome: spontaneous vaginal delivery. Anesthesia: epidural. Postpartum course has been normal except for occasional severe migraine-like HA's.  Headaches were relieved by Percocet.  Baby's course has been normal. Baby is feeding by both breast and bottle - Similac Advance. Bleeding no bleeding. Bowel function is normal. Bladder function is normal. Patient is not sexually active. Contraception method is abstinence. Postpartum depression screening: negative.  Tobacco, alcohol and substance abuse history reviewed.  Adult immunizations reviewed including TDAP, rubella and varicella.  The following portions of the patient's history were reviewed and updated as appropriate: allergies, current medications, past family history, past medical history, past social history, past surgical history and problem list.  Review of Systems A comprehensive review of systems was negative.   Objective:    BP 109/70   Pulse 79   Temp 98.7 F (37.1 C)   Wt 164 lb 6.4 oz (74.6 kg)   LMP 01/29/2016 (Exact Date)   Breastfeeding? Yes   BMI 26.53 kg/m   General:  alert and no distress   Breasts:  inspection negative, no nipple discharge or bleeding, no masses or nodularity palpable  Lungs: clear to auscultation bilaterally  Heart:  regular rate and rhythm, S1, S2 normal, no murmur, click, rub or gallop  Abdomen: soft, non-tender; bowel sounds normal; no masses,  no organomegaly   Vulva:  normal  Vagina: normal vagina  Cervix:  no cervical motion tenderness  Corpus: normal size, contour, position, consistency, mobility, non-tender  Adnexa:  no mass, fullness, tenderness  Rectal Exam: Not performed.          50% of 15 min visit spent on counseling and  coordination of care.  Assessment:     Normal postpartum exam. Pap smear not done at today's visit.     Contraceptive counseling and advice.  Wants OCP's.   Migraine-like HA's.  Plan:    1. Contraception: oral progesterone-only contraceptive 2. Micronor Rx 3. Continue Percocet for HA's.  Inform office if HA's continue after 3 months.  May need referral to Neuro. 4. Follow up in: 6 months or as needed.   Healthy lifestyle practices reviewed

## 2016-03-01 LAB — NUSWAB VG+, CANDIDA 6SP
Atopobium vaginae: HIGH Score — AB
BVAB 2: HIGH {score} — AB
CANDIDA ALBICANS, NAA: NEGATIVE
CANDIDA GLABRATA, NAA: NEGATIVE
CANDIDA PARAPSILOSIS, NAA: NEGATIVE
CANDIDA TROPICALIS, NAA: NEGATIVE
Candida krusei, NAA: NEGATIVE
Candida lusitaniae, NAA: NEGATIVE
Chlamydia trachomatis, NAA: NEGATIVE
MEGASPHAERA 1: HIGH {score} — AB
Neisseria gonorrhoeae, NAA: NEGATIVE
Trich vag by NAA: NEGATIVE

## 2016-03-02 ENCOUNTER — Other Ambulatory Visit: Payer: Self-pay | Admitting: Obstetrics

## 2016-03-02 ENCOUNTER — Telehealth: Payer: Self-pay | Admitting: *Deleted

## 2016-03-02 DIAGNOSIS — N760 Acute vaginitis: Principal | ICD-10-CM

## 2016-03-02 DIAGNOSIS — B9689 Other specified bacterial agents as the cause of diseases classified elsewhere: Secondary | ICD-10-CM

## 2016-03-02 MED ORDER — METRONIDAZOLE 500 MG PO TABS: 500.0000 mg | ORAL_TABLET | Freq: Two times a day (BID) | ORAL | 2 refills | Status: DC

## 2016-03-02 NOTE — Telephone Encounter (Signed)
Phone message left to call office for lab result

## 2016-03-08 ENCOUNTER — Telehealth: Payer: Self-pay | Admitting: *Deleted

## 2016-03-08 NOTE — Telephone Encounter (Signed)
Patient aware of lab result and medication sent to her pharmacy.Cautioned not to drink any alcohol with this medication it can make her vomit.

## 2016-04-12 ENCOUNTER — Other Ambulatory Visit: Payer: Self-pay | Admitting: Obstetrics

## 2016-04-12 ENCOUNTER — Telehealth: Payer: Self-pay | Admitting: *Deleted

## 2016-04-12 DIAGNOSIS — R1013 Epigastric pain: Secondary | ICD-10-CM

## 2016-04-12 NOTE — Telephone Encounter (Signed)
Patient is calling about her abdominal pain- she is still experiencing pain that is unexplained. She thinks that Dr Clearance CootsHarper has ruled out a GYN cause and she is ready to look at other causes. She is also having headaches. She would like to see a GI to start with. Told patient she should keep a diary of everything she eats, every time she uses the bathroom and every time she has pain. She will start.

## 2016-04-13 ENCOUNTER — Encounter: Payer: Self-pay | Admitting: Gastroenterology

## 2016-04-21 ENCOUNTER — Ambulatory Visit (INDEPENDENT_AMBULATORY_CARE_PROVIDER_SITE_OTHER): Payer: Medicaid Other | Admitting: Gastroenterology

## 2016-04-21 ENCOUNTER — Other Ambulatory Visit (INDEPENDENT_AMBULATORY_CARE_PROVIDER_SITE_OTHER): Payer: Medicaid Other

## 2016-04-21 ENCOUNTER — Encounter: Payer: Self-pay | Admitting: Gastroenterology

## 2016-04-21 VITALS — BP 118/66 | HR 113 | Ht 66.0 in | Wt 170.0 lb

## 2016-04-21 DIAGNOSIS — R109 Unspecified abdominal pain: Secondary | ICD-10-CM

## 2016-04-21 DIAGNOSIS — R1084 Generalized abdominal pain: Secondary | ICD-10-CM

## 2016-04-21 DIAGNOSIS — R935 Abnormal findings on diagnostic imaging of other abdominal regions, including retroperitoneum: Secondary | ICD-10-CM

## 2016-04-21 DIAGNOSIS — G8929 Other chronic pain: Secondary | ICD-10-CM

## 2016-04-21 LAB — COMPREHENSIVE METABOLIC PANEL
ALBUMIN: 4.3 g/dL (ref 3.5–5.2)
ALK PHOS: 74 U/L (ref 39–117)
ALT: 17 U/L (ref 0–35)
AST: 16 U/L (ref 0–37)
BILIRUBIN TOTAL: 0.4 mg/dL (ref 0.2–1.2)
BUN: 9 mg/dL (ref 6–23)
CALCIUM: 9.3 mg/dL (ref 8.4–10.5)
CO2: 27 mEq/L (ref 19–32)
CREATININE: 0.69 mg/dL (ref 0.40–1.20)
Chloride: 107 mEq/L (ref 96–112)
GFR: 133.12 mL/min (ref >60.00)
Glucose, Bld: 100 mg/dL — ABNORMAL HIGH (ref 70–99)
Potassium: 3.4 mEq/L — ABNORMAL LOW (ref 3.5–5.1)
SODIUM: 140 meq/L (ref 135–145)
TOTAL PROTEIN: 7.7 g/dL (ref 6.0–8.3)

## 2016-04-21 MED ORDER — OMEPRAZOLE 40 MG PO CPDR: 40.0000 mg | DELAYED_RELEASE_CAPSULE | Freq: Every day | ORAL | 3 refills | Status: DC

## 2016-04-21 NOTE — Progress Notes (Signed)
HPI :  [age] y/o female with history of UTIs, recent pregnancy, referred for abdominal pains, nausea, constipation.  Patient reports 3 years of chronic symptoms of abdominal discomfort. She has mid abdominal pain which can go into her flanks bilaterally, as well as epigastric area. The pain comes and goes. When it comes it bothers her significantly for weeks at a times, present constantly 24/7 during these times. She does have some nausea / vomiting when her pain is severe. She does have some some discomfort in the lower chest a bit with this, she denies heartburn other than during the pregnancy. No odynophagia or dysphagia. Lying on her stomach can make the pain worse, she is not sure if eating can make her symptoms worse, she thinks sometimes it can bother her. Over time symptoms have been stable.She states she has had multiple ER visits in the past due to severe symptoms. She has been told she has had UTIs in the past causing this symptoms. She is not sure if she has had any changes to her urination in the past. She is taking percocet PRN for her pain, in recent months. She reports taking NSAIDs for headaches. She was pregnancy and delivered child in June.  She has some intermittent constipation. She has a few BMs per week to once per day. She does have straining. She denies any blood in her stools. She denies any colon cancer or history of cancer in the family.    In review of prior imaging studies she had a CT scan of abdomen and pelvis August 2015 for her abdominal pain. 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 She denies ever seeing a urologist in the past for this issue    Past Medical History:  Diagnosis Date  . Anxiety   . Depression   . UTI (lower urinary tract infection)      Past Surgical History:  Procedure Laterality Date  . INDUCED ABORTION       Family History  Problem Relation Age of Onset  . Alcohol abuse Neg Hx   . Arthritis Neg Hx   . Asthma Neg Hx   . Cancer Neg Hx   . Birth defects Neg Hx   . COPD Neg Hx   . Depression Neg Hx   . Diabetes Neg Hx   . Drug abuse Neg Hx   . Early death Neg Hx   . Hearing loss Neg Hx   . Heart disease Neg Hx   . Hyperlipidemia Neg Hx   . Hypertension Neg Hx   . Kidney disease Neg Hx   . Learning disabilities Neg Hx   . Mental illness Neg Hx   . Mental retardation Neg Hx   . Miscarriages / Stillbirths Neg Hx   . Stroke Neg Hx   . Vision loss Neg Hx   . Varicose Veins Neg Hx    Social History  Substance Use Topics  . Smoking status: Never Smoker  . Smokeless tobacco: Never Used  . Alcohol use No   Current Outpatient Prescriptions  Medication Sig Dispense Refill  . ibuprofen (ADVIL,MOTRIN) 600 MG tablet Take 1 tablet (600 mg total) by mouth every 6 (six) hours as needed for mild pain. 30 tablet 5  . oxyCODONE-acetaminophen (PERCOCET/ROXICET) 5-325 MG tablet Take 1-2 tablets by mouth every 4 (four) hours as needed for moderate pain or severe pain (pain  scale > 7). 40 tablet 0  . Prenatal Vit-Fe Fumarate-FA (PRENATAL MULTIVITAMIN) TABS tablet Take 1 tablet by mouth daily at 12 noon.     No current facility-administered medications for this visit.    No Known Allergies   Review of Systems: All systems reviewed and negative except where noted in HPI.     Lab Results  Component Value Date   ALT 17 04/21/2016   AST 16 04/21/2016   ALKPHOS 74 04/21/2016   BILITOT 0.4 04/21/2016    Lab Results  Component Value Date   CREATININE 0.69 04/21/2016   BUN 9 04/21/2016   NA 140 04/21/2016   K 3.4 (L) 04/21/2016   CL 107 04/21/2016   CO2 27 04/21/2016      Physical Exam: BP 118/66   Pulse (!) 113   Ht 5\' 6"  (1.676 m)   Wt 170 lb (77.1 kg)   BMI 27.44 kg/m  Constitutional: Pleasant,well-developed, female in no acute distress. HEENT: Normocephalic and atraumatic.  Conjunctivae are normal. No scleral icterus. Neck supple.  Cardiovascular: Normal rate, regular rhythm.  Pulmonary/chest: Effort normal and breath sounds normal. No wheezing, rales or rhonchi. Abdominal: Soft, nondistended, diffusely tender to palpation with (+) Carnett sign. Bowel sounds active throughout. There are no masses palpable. No hepatomegaly. Extremities: no edema Lymphadenopathy: No cervical adenopathy noted. Neurological: Alert and oriented to person place and time. Skin: Skin is warm and dry. No rashes noted. Psychiatric: Normal mood and affect. Behavior is normal.   ASSESSMENT AND PLAN: 25 year old female with medical history as outlined above, presenting with chronic abdominal pain. Her pain is variable and extends from epigastric to midabdomen to bilateral flanks, often present for weeks at a time and constant during these times. She has some questionable prandial relationship. Also noted is report of recurrent UTIs and abnormal CT imaging as outlined above.  Is not clear to me at the present time wants causing her symptoms, and if any of this is related to her prior imaging abnormality. She also has a positive Carnett sign on exam and has some positional component as well, which raises the possibility of abdominal wall pain. At this time recommend an ultrasound of the abdomen, ensure no gallstones causing some of her upper postprandial pain, and we'll reexamine the kidneys in light of prior CT abnormality. Pending this result she may warrant additional imaging. We'll check basic CBC and CMP today, and also check an H. pylori stool antigen. If H. pylori is positive we'll treat her for that and see if she gets any better. After she submits H. pylori stool antigen she can start an empiric trial of PPI, omeprazole 40 mg once a day for a few weeks for some of her upper tract symptoms. We will await labs, imaging, and let her know results. All questions answered.   Ileene PatrickSteven Armbruster,  MD [facility] Pager [phone]

## 2016-04-21 NOTE — Patient Instructions (Signed)
If you are age 25 or older, your body mass index should be between 23-30. Your Body mass index is 27.44 kg/m. If this is out of the aforementioned range listed, please consider follow up with your Primary Care Provider.  If you are age 25 or younger, your body mass index should be between 19-25. Your Body mass index is 27.44 kg/m. If this is out of the aformentioned range listed, please consider follow up with your Primary Care Provider.   We have sent the following medications to your pharmacy for you to pick up at your convenience:  Omeprazole 40mg . Take daily. Dr Adela LankArmbruster request that you wait until after your stool study before starting this medication.  Your physician has requested that you go to the basement for lab work before leaving today.   You have been scheduled [facility] abdominal [facility] (1st floor of hospital) on Wednesday November 8th at 8:00am. Please arrive 15 minutes prior to your appointment for registration. Make certain not to have anything to eat or drink after midnight the night before. Should you need to reschedule your appointment, please contact radiology at [phone]. This test typically takes about 30 minutes to perform.

## 2016-04-22 LAB — CBC WITH DIFFERENTIAL
BASOS: 1 %
Basophils Absolute: 0 10*3/uL (ref 0.0–0.2)
EOS (ABSOLUTE): 0.2 10*3/uL (ref 0.0–0.4)
EOS: 4 %
HEMOGLOBIN: 12.8 g/dL (ref 11.1–15.9)
Hematocrit: 38.1 % (ref 34.0–46.6)
IMMATURE GRANS (ABS): 0 10*3/uL (ref 0.0–0.1)
Immature Granulocytes: 0 %
LYMPHS: 22 %
Lymphocytes Absolute: 1.1 10*3/uL (ref 0.7–3.1)
MCH: 27.7 pg (ref 26.6–33.0)
MCHC: 33.6 g/dL (ref 31.5–35.7)
MCV: 83 fL (ref 79–97)
MONOCYTES: 8 %
Monocytes Absolute: 0.4 10*3/uL (ref 0.1–0.9)
NEUTROS ABS: 3.3 10*3/uL (ref 1.4–7.0)
Neutrophils: 65 %
RBC: 4.62 x10E6/uL (ref 3.77–5.28)
RDW: 14.5 % (ref 12.3–15.4)
WBC: 5 10*3/uL (ref 3.4–10.8)

## 2016-04-24 ENCOUNTER — Encounter: Payer: Self-pay | Admitting: Gastroenterology

## 2016-04-24 NOTE — Progress Notes (Signed)
Letter mailed 04/24/16.

## 2016-04-26 ENCOUNTER — Ambulatory Visit (HOSPITAL_COMMUNITY): Admission: RE | Admit: 2016-04-26 | Payer: Medicaid Other | Source: Ambulatory Visit

## 2016-04-26 ENCOUNTER — Telehealth: Payer: Self-pay | Admitting: Gastroenterology

## 2016-04-26 NOTE — Telephone Encounter (Signed)
Patient ate something this morning, US cancelled. Rescheduled to 11/14 [facility] [address] [facility] at 7:15 for a 7:30 appointment. Patient understands to be NPO after midnight.

## 2016-05-02 ENCOUNTER — Ambulatory Visit (HOSPITAL_COMMUNITY)
Admission: RE | Admit: 2016-05-02 | Discharge: 2016-05-02 | Disposition: A | Discharge status: Home / Self Care | Payer: Medicaid Other | Source: Ambulatory Visit | Attending: Gastroenterology | Admitting: Gastroenterology

## 2016-05-02 ENCOUNTER — Ambulatory Visit (INDEPENDENT_AMBULATORY_CARE_PROVIDER_SITE_OTHER): Payer: Medicaid Other | Admitting: Obstetrics

## 2016-05-02 ENCOUNTER — Encounter: Payer: Self-pay | Admitting: Obstetrics

## 2016-05-02 ENCOUNTER — Encounter: Payer: Self-pay | Admitting: *Deleted

## 2016-05-02 VITALS — BP 122/90 | HR 80 | Temp 97.8°F | Wt 173.5 lb

## 2016-05-02 DIAGNOSIS — Q638 Other specified congenital malformations of kidney: Secondary | ICD-10-CM | POA: Diagnosis not present

## 2016-05-02 DIAGNOSIS — F419 Anxiety disorder, unspecified: Secondary | ICD-10-CM

## 2016-05-02 DIAGNOSIS — G44019 Episodic cluster headache, not intractable: Secondary | ICD-10-CM

## 2016-05-02 DIAGNOSIS — Q625 Duplication of ureter: Secondary | ICD-10-CM | POA: Insufficient documentation

## 2016-05-02 DIAGNOSIS — N898 Other specified noninflammatory disorders of vagina: Secondary | ICD-10-CM

## 2016-05-02 DIAGNOSIS — R1084 Generalized abdominal pain: Secondary | ICD-10-CM | POA: Diagnosis present

## 2016-05-02 DIAGNOSIS — G8929 Other chronic pain: Secondary | ICD-10-CM

## 2016-05-02 DIAGNOSIS — R935 Abnormal findings on diagnostic imaging of other abdominal regions, including retroperitoneum: Secondary | ICD-10-CM

## 2016-05-02 DIAGNOSIS — Z113 Encounter for screening for infections with a predominantly sexual mode of transmission: Secondary | ICD-10-CM | POA: Diagnosis not present

## 2016-05-02 DIAGNOSIS — Z7689 Persons encountering health services in other specified circumstances: Secondary | ICD-10-CM

## 2016-05-02 MED ORDER — BUTALBITAL-APAP-CAFFEINE 50-325-40 MG PO TABS: 1.0000 | ORAL_TABLET | Freq: Four times a day (QID) | ORAL | 2 refills | Status: DC | PRN

## 2016-05-02 MED ORDER — IBUPROFEN 800 MG PO TABS: 800.0000 mg | ORAL_TABLET | Freq: Three times a day (TID) | ORAL | 5 refills | Status: DC | PRN

## 2016-05-02 NOTE — Progress Notes (Signed)
Patient ID: Alisha Wall, female   DOB: [birth year], 25 y.o.   MRN: [id number]  Chief Complaint  Patient presents with  . other    STD screening    HPI Alisha Wall is a 25 y.o. female.  Increased vaginal discharge.  Denies vaginal odor or irritation.  Headaches have continued since last visit.  Still has significant anxiety. HPI  Past Medical History:  Diagnosis Date  . Anxiety   . Depression   . UTI (lower urinary tract infection)     Past Surgical History:  Procedure Laterality Date  . INDUCED ABORTION      Family History  Problem Relation Age of Onset  . Alcohol abuse Neg Hx   . Arthritis Neg Hx   . Asthma Neg Hx   . Cancer Neg Hx   . Birth defects Neg Hx   . COPD Neg Hx   . Depression Neg Hx   . Diabetes Neg Hx   . Drug abuse Neg Hx   . Early death Neg Hx   . Hearing loss Neg Hx   . Heart disease Neg Hx   . Hyperlipidemia Neg Hx   . Hypertension Neg Hx   . Kidney disease Neg Hx   . Learning disabilities Neg Hx   . Mental illness Neg Hx   . Mental retardation Neg Hx   . Miscarriages / Stillbirths Neg Hx   . Stroke Neg Hx   . Vision loss Neg Hx   . Varicose Veins Neg Hx     Social History Social History  Substance Use Topics  . Smoking status: Never Smoker  . Smokeless tobacco: Never Used  . Alcohol use No    No Known Allergies  Current Outpatient Prescriptions  Medication Sig Dispense Refill  . Prenatal Vit-Fe Fumarate-FA (PRENATAL MULTIVITAMIN) TABS tablet Take 1 tablet by mouth daily at 12 noon.    Marland Kitchen. ibuprofen (ADVIL,MOTRIN) 600 MG tablet Take 1 tablet (600 mg total) by mouth every 6 (six) hours as needed for mild pain. (Patient not taking: Reported on 05/02/2016) 30 tablet 5  . omeprazole (PRILOSEC) 40 MG capsule Take 1 capsule (40 mg total) by mouth daily. (Patient not taking: Reported on 05/02/2016) 30 capsule 3  . oxyCODONE-acetaminophen (PERCOCET/ROXICET) 5-325 MG tablet Take 1-2 tablets by mouth every 4 (four) hours as needed for  moderate pain or severe pain (pain scale > 7). (Patient not taking: Reported on 05/02/2016) 40 tablet 0   No current facility-administered medications for this visit.     Review of Systems Review of Systems Constitutional: negative for fatigue and weight loss Respiratory: negative for cough and wheezing Cardiovascular: negative for chest pain, fatigue and palpitations Gastrointestinal: negative for abdominal pain and change in bowel habits Genitourinary:positive for vaginal discharge Integument/breast: negative for nipple discharge Musculoskeletal:negative for myalgias Neurological: positive for headaches Behavioral/Psych: positive for anxiety and depression Endocrine: negative for temperature intolerance      Blood pressure 122/90, pulse 80, temperature 97.8 F (36.6 C), temperature source Oral, weight 173 lb 8 oz (78.7 kg), last menstrual period 04/19/2016, currently breastfeeding.  Physical Exam Physical Exam           General:  Alert and no distress          HEENT:  Normal findings Abdomen:  normal findings: no organomegaly, soft, non-tender and no hernia  Pelvis:  External genitalia: normal general appearance Urinary system: urethral meatus normal and bladder without fullness, nontender Vaginal: normal without tenderness, induration or masses Cervix:  normal appearance Adnexa: normal bimanual exam Uterus: anteverted and non-tender, normal size    50% of 15 min visit spent on counseling and coordination of care.    Data Reviewed Labs  Assessment     Vaginal discharge STD screening Headaches, chronic Anxiety and Depression    Plan    Wet prep and cultures sent Fioricet Rx for headaches and patient referred to Neurology [facility] and [facility]   No orders of the defined types were placed in this encounter.  No orders of the defined types were placed in this encounter.

## 2016-05-03 ENCOUNTER — Other Ambulatory Visit: Payer: Self-pay

## 2016-05-07 ENCOUNTER — Other Ambulatory Visit: Payer: Self-pay | Admitting: Obstetrics

## 2016-05-07 DIAGNOSIS — B9689 Other specified bacterial agents as the cause of diseases classified elsewhere: Secondary | ICD-10-CM

## 2016-05-07 DIAGNOSIS — A749 Chlamydial infection, unspecified: Secondary | ICD-10-CM

## 2016-05-07 DIAGNOSIS — N760 Acute vaginitis: Principal | ICD-10-CM

## 2016-05-07 DIAGNOSIS — A549 Gonococcal infection, unspecified: Secondary | ICD-10-CM

## 2016-05-07 LAB — NUSWAB VG+, CANDIDA 6SP
Atopobium vaginae: HIGH Score — AB
BVAB 2: HIGH {score} — AB
CANDIDA ALBICANS, NAA: NEGATIVE
CANDIDA GLABRATA, NAA: NEGATIVE
CANDIDA PARAPSILOSIS, NAA: NEGATIVE
CANDIDA TROPICALIS, NAA: NEGATIVE
Candida krusei, NAA: NEGATIVE
Candida lusitaniae, NAA: NEGATIVE
Chlamydia trachomatis, NAA: POSITIVE — AB
MEGASPHAERA 1: HIGH {score} — AB
Neisseria gonorrhoeae, NAA: POSITIVE — AB
Trich vag by NAA: NEGATIVE

## 2016-05-07 MED ORDER — AZITHROMYCIN 250 MG PO TABS: 1000.0000 mg | ORAL_TABLET | Freq: Once | ORAL | 0 refills | Status: AC

## 2016-05-07 MED ORDER — CEFTRIAXONE SODIUM 250 MG IJ SOLR: 250.0000 mg | Freq: Once | INTRAMUSCULAR | Status: DC

## 2016-05-07 MED ORDER — METRONIDAZOLE 500 MG PO TABS: 500.0000 mg | ORAL_TABLET | Freq: Two times a day (BID) | ORAL | 2 refills | Status: DC

## 2016-05-08 ENCOUNTER — Encounter: Payer: Self-pay | Admitting: *Deleted

## 2016-05-08 ENCOUNTER — Institutional Professional Consult (permissible substitution): Payer: Medicaid Other

## 2016-05-08 ENCOUNTER — Other Ambulatory Visit: Payer: Self-pay | Admitting: Obstetrics

## 2016-05-08 ENCOUNTER — Ambulatory Visit: Payer: Medicaid Other

## 2016-05-09 ENCOUNTER — Ambulatory Visit (INDEPENDENT_AMBULATORY_CARE_PROVIDER_SITE_OTHER): Payer: Medicaid Other | Admitting: *Deleted

## 2016-05-09 VITALS — BP 105/66 | HR 83 | Wt 168.0 lb

## 2016-05-09 DIAGNOSIS — A549 Gonococcal infection, unspecified: Secondary | ICD-10-CM

## 2016-05-09 MED ORDER — CEFTRIAXONE SODIUM 250 MG IJ SOLR
250.0000 mg | Freq: Once | INTRAMUSCULAR | Status: AC
  Administered 2016-05-09: 250 mg via INTRAMUSCULAR

## 2016-06-20 ENCOUNTER — Ambulatory Visit: Payer: Medicaid Other | Admitting: Obstetrics

## 2016-07-04 ENCOUNTER — Ambulatory Visit: Payer: Medicaid Other | Admitting: Obstetrics

## 2016-07-12 ENCOUNTER — Ambulatory Visit: Payer: Medicaid Other | Admitting: Obstetrics

## 2016-08-16 ENCOUNTER — Encounter: Payer: Self-pay | Admitting: Obstetrics

## 2016-08-16 ENCOUNTER — Other Ambulatory Visit (HOSPITAL_COMMUNITY)
Admission: RE | Admit: 2016-08-16 | Discharge: 2016-08-16 | Disposition: A | Discharge status: Home / Self Care | Payer: Medicaid Other | Source: Ambulatory Visit | Attending: Obstetrics | Admitting: Obstetrics

## 2016-08-16 ENCOUNTER — Ambulatory Visit (INDEPENDENT_AMBULATORY_CARE_PROVIDER_SITE_OTHER): Payer: Medicaid Other | Admitting: Obstetrics

## 2016-08-16 VITALS — BP 115/73 | HR 92 | Ht 66.0 in | Wt 169.6 lb

## 2016-08-16 DIAGNOSIS — A749 Chlamydial infection, unspecified: Secondary | ICD-10-CM | POA: Diagnosis not present

## 2016-08-16 DIAGNOSIS — A549 Gonococcal infection, unspecified: Secondary | ICD-10-CM | POA: Diagnosis not present

## 2016-08-16 NOTE — Progress Notes (Signed)
Patient ID: Alisha Darbyiffany J Kalt, female   DOB: 1990-08-23, 26 y.o.   MRN: [id number]  No chief complaint on file.   HPI Alisha Wall is a 26 y.o. female.  [facility]  No complaints. HPI  Past Medical History:  Diagnosis Date  . Anxiety   . Depression   . UTI (lower urinary tract infection)     Past Surgical History:  Procedure Laterality Date  . INDUCED ABORTION      Family History  Problem Relation Age of Onset  . Alcohol abuse Neg Hx   . Arthritis Neg Hx   . Asthma Neg Hx   . Cancer Neg Hx   . Birth defects Neg Hx   . COPD Neg Hx   . Depression Neg Hx   . Diabetes Neg Hx   . Drug abuse Neg Hx   . Early death Neg Hx   . Hearing loss Neg Hx   . Heart disease Neg Hx   . Hyperlipidemia Neg Hx   . Hypertension Neg Hx   . Kidney disease Neg Hx   . Learning disabilities Neg Hx   . Mental illness Neg Hx   . Mental retardation Neg Hx   . Miscarriages / Stillbirths Neg Hx   . Stroke Neg Hx   . Vision loss Neg Hx   . Varicose Veins Neg Hx     Social History Social History  Substance Use Topics  . Smoking status: Never Smoker  . Smokeless tobacco: Never Used  . Alcohol use No    No Known Allergies  Current Outpatient Prescriptions  Medication Sig Dispense Refill  . Prenatal Vit-Fe Fumarate-FA (PRENATAL MULTIVITAMIN) TABS tablet Take 1 tablet by mouth daily at 12 noon.    . butalbital-acetaminophen-caffeine (FIORICET, ESGIC) 50-325-40 MG tablet Take 1-2 tablets by mouth every 6 (six) hours as needed for headache. (Patient not taking: Reported on 08/16/2016) 30 tablet 2  . ibuprofen (ADVIL,MOTRIN) 600 MG tablet Take 1 tablet (600 mg total) by mouth every 6 (six) hours as needed for mild pain. (Patient not taking: Reported on 05/02/2016) 30 tablet 5  . ibuprofen (ADVIL,MOTRIN) 800 MG tablet Take 1 tablet (800 mg total) by mouth every 8 (eight) hours as needed. (Patient not taking: Reported on 08/16/2016) 30 tablet 5  . metroNIDAZOLE (FLAGYL)  500 MG tablet Take 1 tablet (500 mg total) by mouth 2 (two) times daily. (Patient not taking: Reported on 08/16/2016) 14 tablet 2  . omeprazole (PRILOSEC) 40 MG capsule Take 1 capsule (40 mg total) by mouth daily. (Patient not taking: Reported on 05/02/2016) 30 capsule 3  . oxyCODONE-acetaminophen (PERCOCET/ROXICET) 5-325 MG tablet Take 1-2 tablets by mouth every 4 (four) hours as needed for moderate pain or severe pain (pain scale > 7). (Patient not taking: Reported on 05/02/2016) 40 tablet 0   Current Facility-Administered Medications  Medication Dose Route Frequency Provider Last Rate Last Dose  . cefTRIAXone (ROCEPHIN) injection 250 mg  250 mg Intramuscular Once Brock Badharles A Dailah Opperman, MD        Review of Systems Review of Systems Constitutional: negative for fatigue and weight loss Respiratory: negative for cough and wheezing Cardiovascular: negative for chest pain, fatigue and palpitations Gastrointestinal: negative for abdominal pain and change in bowel habits Genitourinary:negative Integument/breast: negative for nipple discharge Musculoskeletal:negative for myalgias Neurological: negative for gait problems and tremors Behavioral/Psych: negative for abusive relationship, depression Endocrine: negative for temperature intolerance      Blood pressure 115/73, pulse 92,  height 5\' 6"  (1.676 m), weight 169 lb 9.6 oz (76.9 kg), last menstrual period 08/12/2016, not currently breastfeeding.  Physical Exam Physical Exam           General:  Alert and no distress Abdomen:  normal findings: no organomegaly, soft, non-tender and no hernia  Pelvis:  External genitalia: normal general appearance Urinary system: urethral meatus normal and bladder without fullness, nontender Vaginal: normal without tenderness, induration or masses Cervix: normal appearance Adnexa: normal bimanual exam Uterus: anteverted and non-tender, normal size    50% of 15 min visit spent on counseling and coordination of  care.    Data Reviewed Wet prep and cultures  Assessment     H/O Gonorrhea and Chlamydia infections, treated, and partner treated.    Plan    TOC cultures done F/U for pap  No orders of the defined types were placed in this encounter.  No orders of the defined types were placed in this encounter.

## 2016-08-16 NOTE — Progress Notes (Signed)
TOC for GC/CT

## 2016-08-17 ENCOUNTER — Other Ambulatory Visit: Payer: Self-pay | Admitting: Obstetrics

## 2016-08-17 DIAGNOSIS — A749 Chlamydial infection, unspecified: Secondary | ICD-10-CM

## 2016-08-17 LAB — CERVICOVAGINAL ANCILLARY ONLY
Bacterial vaginitis: NEGATIVE
CANDIDA VAGINITIS: NEGATIVE
Chlamydia: POSITIVE — AB
NEISSERIA GONORRHEA: NEGATIVE
TRICH (WINDOWPATH): NEGATIVE

## 2016-08-17 MED ORDER — AZITHROMYCIN 500 MG PO TABS: 1000.0000 mg | ORAL_TABLET | Freq: Once | ORAL | 0 refills | Status: AC

## 2016-08-17 MED ORDER — CEFIXIME 400 MG PO TABS: 400.0000 mg | ORAL_TABLET | Freq: Once | ORAL | 0 refills | Status: AC

## 2016-08-23 ENCOUNTER — Telehealth: Payer: Self-pay

## 2016-08-23 NOTE — Telephone Encounter (Signed)
Spoke with patient and advised of results, treatment, and follow testing.

## 2016-09-04 ENCOUNTER — Ambulatory Visit: Payer: Medicaid Other | Admitting: Obstetrics

## 2016-09-26 ENCOUNTER — Encounter: Payer: Self-pay | Admitting: Obstetrics

## 2016-09-26 ENCOUNTER — Other Ambulatory Visit (HOSPITAL_COMMUNITY)
Admission: RE | Admit: 2016-09-26 | Discharge: 2016-09-26 | Disposition: A | Discharge status: Home / Self Care | Payer: Medicaid Other | Source: Ambulatory Visit | Attending: Obstetrics | Admitting: Obstetrics

## 2016-09-26 ENCOUNTER — Ambulatory Visit (INDEPENDENT_AMBULATORY_CARE_PROVIDER_SITE_OTHER): Payer: Medicaid Other | Admitting: Obstetrics

## 2016-09-26 VITALS — BP 104/67 | HR 71 | Temp 96.8°F | Wt 168.0 lb

## 2016-09-26 DIAGNOSIS — Z01419 Encounter for gynecological examination (general) (routine) without abnormal findings: Secondary | ICD-10-CM

## 2016-09-26 DIAGNOSIS — Z113 Encounter for screening for infections with a predominantly sexual mode of transmission: Secondary | ICD-10-CM | POA: Insufficient documentation

## 2016-09-26 DIAGNOSIS — F321 Major depressive disorder, single episode, moderate: Secondary | ICD-10-CM

## 2016-09-26 DIAGNOSIS — N898 Other specified noninflammatory disorders of vagina: Secondary | ICD-10-CM

## 2016-09-26 NOTE — Progress Notes (Signed)
Subjective:        Alisha Wall is a 26 y.o. female here for a routine exam.  Current complaints: Sad and crying spontaneously.  Has some family issues, etc that is causing much stress and anxiety.  Personal health questionnaire:  Is patient [name], have a family history of breast and/or ovarian cancer: no Is there a family history of uterine cancer diagnosed at age < 29, gastrointestinal cancer, urinary tract cancer, family member who is a Personnel officer syndrome-associated carrier: no Is the patient overweight and hypertensive, family history of diabetes, personal history of gestational diabetes, preeclampsia or PCOS: no Is patient over 4, have PCOS,  family history of premature CHD under age 70, diabetes, smoke, have hypertension or peripheral artery disease:  no At any time, has a partner hit, kicked or otherwise hurt or frightened you?: no Over the past 2 weeks, have you felt down, depressed or hopeless?: no Over the past 2 weeks, have you felt little interest or pleasure in doing things?:no   Gynecologic History Patient's last menstrual period was 09/06/2016 (approximate). Contraception: none Last Pap: 2017. Results were: normal Last mammogram: n/a. Results were: n/a  Obstetric History OB History  Gravida Para Term Preterm AB Living  SAB TAB Ectopic Multiple Live Births  1 1   0 3    # Outcome Date GA Lbr Len/2nd Weight Sex Delivery Anes PTL Lv  5 Term 11/26/15 [redacted]w[redacted]d 10:04 / 00:20 7 lb 12.2 oz (3.52 kg) M Vag-Spont EPI  LIV  4 Term 12/22/11    M Vag-Spont EPI  LIV  3 Term 11/25/10    M Vag-Spont EPI  LIV  2 TAB 2011          1 SAB               Past Medical History:  Diagnosis Date  . Anxiety   . Depression   . UTI (lower urinary tract infection)     Past Surgical History:  Procedure Laterality Date  . INDUCED ABORTION       Current Outpatient Prescriptions:  .  butalbital-acetaminophen-caffeine (FIORICET, ESGIC) 50-325-40 MG tablet,  Take 1-2 tablets by mouth every 6 (six) hours as needed for headache. (Patient not taking: Reported on 08/16/2016), Disp: 30 tablet, Rfl: 2 .  ibuprofen (ADVIL,MOTRIN) 600 MG tablet, Take 1 tablet (600 mg total) by mouth every 6 (six) hours as needed for mild pain. (Patient not taking: Reported on 05/02/2016), Disp: 30 tablet, Rfl: 5 .  ibuprofen (ADVIL,MOTRIN) 800 MG tablet, Take 1 tablet (800 mg total) by mouth every 8 (eight) hours as needed. (Patient not taking: Reported on 08/16/2016), Disp: 30 tablet, Rfl: 5 .  metroNIDAZOLE (FLAGYL) 500 MG tablet, Take 1 tablet (500 mg total) by mouth 2 (two) times daily. (Patient not taking: Reported on 08/16/2016), Disp: 14 tablet, Rfl: 2 .  omeprazole (PRILOSEC) 40 MG capsule, Take 1 capsule (40 mg total) by mouth daily. (Patient not taking: Reported on 05/02/2016), Disp: 30 capsule, Rfl: 3 .  oxyCODONE-acetaminophen (PERCOCET/ROXICET) 5-325 MG tablet, Take 1-2 tablets by mouth every 4 (four) hours as needed for moderate pain or severe pain (pain scale > 7). (Patient not taking: Reported on 05/02/2016), Disp: 40 tablet, Rfl: 0 .  Prenatal Vit-Fe Fumarate-FA (PRENATAL MULTIVITAMIN) TABS tablet, Take 1 tablet by mouth daily at 12 noon., Disp: , Rfl:   Current Facility-Administered Medications:  .  cefTRIAXone (ROCEPHIN) injection 250 mg, 250 mg, Intramuscular,  Once, Brock Bad, MD No Known Allergies  Social History  Substance Use Topics  . Smoking status: Never Smoker  . Smokeless tobacco: Never Used  . Alcohol use No    Family History  Problem Relation Age of Onset  . Alcohol abuse Neg Hx   . Arthritis Neg Hx   . Asthma Neg Hx   . Cancer Neg Hx   . Birth defects Neg Hx   . COPD Neg Hx   . Depression Neg Hx   . Diabetes Neg Hx   . Drug abuse Neg Hx   . Early death Neg Hx   . Hearing loss Neg Hx   . Heart disease Neg Hx   . Hyperlipidemia Neg Hx   . Hypertension Neg Hx   . Kidney disease Neg Hx   . Learning disabilities Neg Hx   . Mental  illness Neg Hx   . Mental retardation Neg Hx   . Miscarriages / Stillbirths Neg Hx   . Stroke Neg Hx   . Vision loss Neg Hx   . Varicose Veins Neg Hx       Review of Systems  Constitutional: negative for fatigue and weight loss Respiratory: negative for cough and wheezing Cardiovascular: negative for chest pain, fatigue and palpitations Gastrointestinal: negative for abdominal pain and change in bowel habits Musculoskeletal:negative for myalgias Neurological: negative for gait problems and tremors Behavioral/Psych: negative for abusive relationship, depression Endocrine: negative for temperature intolerance    Genitourinary:negative for abnormal menstrual periods, genital lesions, hot flashes, sexual problems and vaginal discharge Integument/breast: negative for breast lump, breast tenderness, nipple discharge and skin lesion(s)    Objective:       BP 104/67   Pulse 71   Temp (!) 96.8 F (36 C)   Wt 168 lb (76.2 kg)   LMP 09/06/2016 (Approximate)   Breastfeeding? No   BMI 27.12 kg/m  General:   alert  Skin:   no rash or abnormalities  Lungs:   clear to auscultation bilaterally  Heart:   regular rate and rhythm, S1, S2 normal, no murmur, click, rub or gallop  Breasts:   normal without suspicious masses, skin or nipple changes or axillary nodes  Abdomen:  normal findings: no organomegaly, soft, non-tender and no hernia  Pelvis:  External genitalia: normal general appearance Urinary system: urethral meatus normal and bladder without fullness, nontender Vaginal: normal without tenderness, induration or masses Cervix: normal appearance Adnexa: normal bimanual exam Uterus: anteverted and non-tender, normal size   Lab Review Urine pregnancy test Labs reviewed yes Radiologic studies reviewed no  50% of 20 min visit spent on counseling and coordination of care.    Assessment:    Healthy female exam.    Depression.  Discussed Counseling Services and patient agreed to  attend counseling.  Does not want meds.   Plan:   Referred to [name]  Education reviewed: calcium supplements, depression evaluation, low fat, low cholesterol diet, safe sex/STD prevention, self breast exams and weight bearing exercise. Contraception: Depo-Provera injections. Follow up in: 1 year.   No orders of the defined types were placed in this encounter.  Orders Placed This Encounter  Procedures  . HIV antibody  . Hepatitis B surface antigen  . RPR  . Hepatitis C antibody  . Ambulatory referral to Social Work    Referral Priority:   Routine    Referral Type:   Consultation    Referral Reason:   Specialty Services Required    Number  of Visits Requested:   1  . Ambulatory referral to Integrated Behavioral Health    Referral Priority:   Routine    Referral Type:   Consultation    Referral Reason:   Specialty Services Required    Number of Visits Requested:   1     Patient ID: Alisha Wall, female   DOB: Oct 29, 1990, 26 y.o.   MRN: [id number]

## 2016-09-26 NOTE — Progress Notes (Signed)
Patient presents for annual exam. Patient is struggling with family issues, finances, relationship and school.

## 2016-09-27 ENCOUNTER — Other Ambulatory Visit: Payer: Self-pay | Admitting: Obstetrics

## 2016-09-27 DIAGNOSIS — A568 Sexually transmitted chlamydial infection of other sites: Secondary | ICD-10-CM

## 2016-09-27 LAB — HEPATITIS B SURFACE ANTIGEN: Hepatitis B Surface Ag: NEGATIVE

## 2016-09-27 LAB — CERVICOVAGINAL ANCILLARY ONLY
Bacterial vaginitis: POSITIVE — AB
CHLAMYDIA, DNA PROBE: POSITIVE — AB
Candida vaginitis: NEGATIVE
NEISSERIA GONORRHEA: NEGATIVE
Trichomonas: NEGATIVE

## 2016-09-27 LAB — HIV ANTIBODY (ROUTINE TESTING W REFLEX): HIV Screen 4th Generation wRfx: NONREACTIVE

## 2016-09-27 LAB — HEPATITIS C ANTIBODY: Hep C Virus Ab: <0.1 s/co ratio (ref 0.0–0.9)

## 2016-09-27 LAB — RPR: RPR Ser Ql: NONREACTIVE

## 2016-09-27 MED ORDER — AZITHROMYCIN 500 MG PO TABS: 1000.0000 mg | ORAL_TABLET | Freq: Every day | ORAL | 0 refills | Status: DC

## 2016-09-27 MED ORDER — CEFIXIME 400 MG PO TABS: 400.0000 mg | ORAL_TABLET | Freq: Every day | ORAL | 0 refills | Status: DC

## 2016-09-28 ENCOUNTER — Other Ambulatory Visit: Payer: Self-pay | Admitting: *Deleted

## 2016-09-28 DIAGNOSIS — B9689 Other specified bacterial agents as the cause of diseases classified elsewhere: Secondary | ICD-10-CM

## 2016-09-28 DIAGNOSIS — N760 Acute vaginitis: Principal | ICD-10-CM

## 2016-09-28 LAB — CYTOLOGY - PAP: DIAGNOSIS: NEGATIVE

## 2016-09-28 MED ORDER — METRONIDAZOLE 500 MG PO TABS: 500.0000 mg | ORAL_TABLET | Freq: Two times a day (BID) | ORAL | 0 refills | Status: DC

## 2016-09-28 NOTE — Progress Notes (Signed)
Flagyl ordered for +BV on lab results

## 2016-11-08 ENCOUNTER — Encounter: Payer: Medicaid Other | Admitting: Obstetrics

## 2016-12-06 ENCOUNTER — Encounter (HOSPITAL_COMMUNITY): Payer: Self-pay

## 2016-12-06 ENCOUNTER — Emergency Department (HOSPITAL_COMMUNITY)
Admission: EM | Admit: 2016-12-06 | Discharge: 2016-12-06 | Disposition: A | Discharge status: Home / Self Care | Payer: Medicaid Other | Attending: Emergency Medicine | Admitting: Emergency Medicine

## 2016-12-06 DIAGNOSIS — Z79899 Other long term (current) drug therapy: Secondary | ICD-10-CM | POA: Diagnosis not present

## 2016-12-06 DIAGNOSIS — K029 Dental caries, unspecified: Secondary | ICD-10-CM | POA: Diagnosis not present

## 2016-12-06 DIAGNOSIS — K0889 Other specified disorders of teeth and supporting structures: Secondary | ICD-10-CM | POA: Diagnosis present

## 2016-12-06 DIAGNOSIS — H9190 Unspecified hearing loss, unspecified ear: Secondary | ICD-10-CM | POA: Insufficient documentation

## 2016-12-06 DIAGNOSIS — H9202 Otalgia, left ear: Secondary | ICD-10-CM | POA: Diagnosis not present

## 2016-12-06 MED ORDER — PENICILLIN V POTASSIUM 500 MG PO TABS: 500.0000 mg | ORAL_TABLET | Freq: Three times a day (TID) | ORAL | 0 refills | Status: DC

## 2016-12-06 MED ORDER — IBUPROFEN 800 MG PO TABS
800.0000 mg | ORAL_TABLET | Freq: Once | ORAL | Status: AC
  Administered 2016-12-06: 800 mg via ORAL
  Filled 2016-12-06: qty 1

## 2016-12-06 MED ORDER — IBUPROFEN 600 MG PO TABS: 600.0000 mg | ORAL_TABLET | Freq: Four times a day (QID) | ORAL | 5 refills | Status: DC | PRN

## 2016-12-06 NOTE — ED Notes (Signed)
EDP at bedside  

## 2016-12-06 NOTE — ED Provider Notes (Signed)
[facility]-EMERGENCY DEPT Provider Note   CSN: [id number] Arrival date & time: 12/06/16  1528  By signing my name below, I, Cynda AcresHailei Fulton, attest that this documentation has been prepared under the direction and in the presence of Fayrene HelperBowie Nafeesa Dils, PA-C. Electronically Signed: Cynda AcresHailei Fulton, Scribe. 12/06/16. 4:06 PM.  History   Chief Complaint Chief Complaint  Patient presents with  . Dental Pain    HPI Comments: Alisha Wall is a 26 y.o. female with no pertinent past medical history, who presents to the Emergency Department complaining of sudden-onset, constant left upper dental pain that began 1.5 weeks ago. Patient reports progressively worsening dental pain. Patient describes her pain as severe, throbbing. Patient has never had problems with this tooth in the past. Patient has seen her primary physician for this problem recently. Patient reports associated radiation to the left ear and loss of hearing. Patient reports taking Excedrin with no relief. Patient states cold air, eating, and drinking cold fluids makes her pain worse. Patient is scheduled for a dental appointment on 12/13/16. Patient denies any fever, chills, nausea, vomiting, discharge, or facial swelling.   The history is provided by the patient. No language interpreter was used.    Past Medical History:  Diagnosis Date  . Anxiety   . Depression   . UTI (lower urinary tract infection)     Patient Active Problem List   Diagnosis Date Noted  . NSVD (normal spontaneous vaginal delivery) 11/27/2015  . Active labor at term 11/26/2015    Past Surgical History:  Procedure Laterality Date  . INDUCED ABORTION      OB History    Gravida Para Term Preterm AB Living   5 3 3   2 3    SAB TAB Ectopic Multiple Live Births   1 1   0 3       Home Medications    Prior to Admission medications   Medication Sig Start Date End Date Taking? Authorizing Provider  azithromycin (ZITHROMAX) 500 MG tablet Take 2 tablets (1,000 mg  total) by mouth daily. 09/27/16   Brock BadHarper, Charles A, MD  butalbital-acetaminophen-caffeine (FIORICET, ESGIC) [phone] MG tablet Take 1-2 tablets by mouth every 6 (six) hours as needed for headache. Patient not taking: Reported on 08/16/2016 05/02/16 05/02/17  Brock BadHarper, Charles A, MD  cefixime (SUPRAX) 400 MG tablet Take 1 tablet (400 mg total) by mouth daily. 09/27/16   Brock BadHarper, Charles A, MD  ibuprofen (ADVIL,MOTRIN) 600 MG tablet Take 1 tablet (600 mg total) by mouth every 6 (six) hours as needed for mild pain. Patient not taking: Reported on 05/02/2016 02/24/16   Brock BadHarper, Charles A, MD  ibuprofen (ADVIL,MOTRIN) 800 MG tablet Take 1 tablet (800 mg total) by mouth every 8 (eight) hours as needed. Patient not taking: Reported on 08/16/2016 05/02/16   Brock BadHarper, Charles A, MD  metroNIDAZOLE (FLAGYL) 500 MG tablet Take 1 tablet (500 mg total) by mouth 2 (two) times daily. 09/28/16   Brock BadHarper, Charles A, MD  omeprazole (PRILOSEC) 40 MG capsule Take 1 capsule (40 mg total) by mouth daily. Patient not taking: Reported on 05/02/2016 04/21/16   Armbruster, Reeves ForthSteven Paul, MD  oxyCODONE-acetaminophen (PERCOCET/ROXICET) 5-325 MG tablet Take 1-2 tablets by mouth every 4 (four) hours as needed for moderate pain or severe pain (pain scale > 7). Patient not taking: Reported on 05/02/2016 02/24/16   Brock BadHarper, Charles A, MD  Prenatal Vit-Fe Fumarate-FA (PRENATAL MULTIVITAMIN) TABS tablet Take 1 tablet by mouth daily at 12 noon.    [provider]  Family History Family History  Problem Relation Age of Onset  . Alcohol abuse Neg Hx   . Arthritis Neg Hx   . Asthma Neg Hx   . Cancer Neg Hx   . Birth defects Neg Hx   . COPD Neg Hx   . Depression Neg Hx   . Diabetes Neg Hx   . Drug abuse Neg Hx   . Early death Neg Hx   . Hearing loss Neg Hx   . Heart disease Neg Hx   . Hyperlipidemia Neg Hx   . Hypertension Neg Hx   . Kidney disease Neg Hx   . Learning disabilities Neg Hx   . Mental illness Neg Hx   . Mental  retardation Neg Hx   . Miscarriages / Stillbirths Neg Hx   . Stroke Neg Hx   . Vision loss Neg Hx   . Varicose Veins Neg Hx     Social History Social History  Substance Use Topics  . Smoking status: Never Smoker  . Smokeless tobacco: Never Used  . Alcohol use No     Allergies   Patient has no known allergies.   Review of Systems Review of Systems  Constitutional: Negative for chills and fever.  HENT: Positive for dental problem, ear pain (left) and hearing loss. Negative for facial swelling.   Gastrointestinal: Negative for nausea and vomiting.     Physical Exam Updated Vital Signs BP 124/79 (BP Location: Right Arm)   Pulse 65   Temp 98.6 F (37 C) (Oral)   Resp 18   Ht 5\' 6"  (1.676 m)   Wt 169 lb (76.7 kg)   LMP 11/25/2016   SpO2 98%   BMI 27.28 kg/m   Physical Exam  Constitutional: She is oriented to person, place, and time. She appears well-developed and well-nourished.  HENT:  Head: Normocephalic and atraumatic.  Right Ear: External ear normal.  Left Ear: External ear normal.  Tenderness to tooth number 16 with dental decay. No obvious abscess or trismus.   Eyes: EOM are normal. Pupils are equal, round, and reactive to light.  Neck: Normal range of motion. Neck supple.  Cardiovascular: Normal rate and regular rhythm.   Pulmonary/Chest: Effort normal and breath sounds normal.  Musculoskeletal: Normal range of motion.  Neurological: She is alert and oriented to person, place, and time.  Skin: Skin is warm and dry.  Psychiatric: She has a normal mood and affect.  Nursing note and vitals reviewed.    ED Treatments / Results  DIAGNOSTIC STUDIES: Oxygen Saturation is 98% on RA, normal by my interpretation.    COORDINATION OF CARE: 4:05 PM Discussed treatment plan with pt at bedside and pt agreed to plan, which includes anti-inflammatory pain medication and antibiotics.   Labs (all labs ordered are listed, but only abnormal results are  displayed) Labs Reviewed - No data to display  EKG  EKG Interpretation None       Radiology No results found.  Procedures Procedures (including critical care time)  Medications Ordered in ED Medications - No data to display   Initial Impression / Assessment and Plan / ED Course  I have reviewed the triage vital signs and the nursing notes.  Pertinent labs & imaging results that were available during my care of the patient were reviewed by me and considered in my medical decision making (see chart for details).     BP 124/79 (BP Location: Right Arm)   Pulse 65   Temp 98.6 F (37  C) (Oral)   Resp 18   Ht 5\' 6"  (1.676 m)   Wt 76.7 kg (169 lb)   LMP 11/25/2016   SpO2 98%   BMI 27.28 kg/m    Final Clinical Impressions(s) / ED Diagnoses   Final diagnoses:  Pain due to dental caries    New Prescriptions New Prescriptions   PENICILLIN V POTASSIUM (VEETID) 500 MG TABLET    Take 1 tablet (500 mg total) by mouth 3 (three) times daily.   I personally performed the services described in this documentation, which was scribed in my presence. The recorded information has been reviewed and is accurate.       Fayrene Helper, PA-C 12/06/16 1613    Mancel Bale, MD 12/06/16 1745

## 2016-12-06 NOTE — ED Triage Notes (Signed)
Pt. Here for left-sided dental pain x1 week. Has appointment to see dentist on the 27th, but is in so much pain that she wanted to be seen here.

## 2017-01-30 ENCOUNTER — Encounter: Payer: Self-pay | Admitting: Obstetrics

## 2017-01-30 ENCOUNTER — Other Ambulatory Visit (HOSPITAL_COMMUNITY)
Admission: RE | Admit: 2017-01-30 | Discharge: 2017-01-30 | Disposition: A | Discharge status: Home / Self Care | Payer: Medicaid Other | Source: Ambulatory Visit | Attending: Obstetrics | Admitting: Obstetrics

## 2017-01-30 ENCOUNTER — Ambulatory Visit (INDEPENDENT_AMBULATORY_CARE_PROVIDER_SITE_OTHER): Payer: Medicaid Other | Admitting: Obstetrics

## 2017-01-30 DIAGNOSIS — R399 Unspecified symptoms and signs involving the genitourinary system: Secondary | ICD-10-CM | POA: Diagnosis present

## 2017-01-30 DIAGNOSIS — F329 Major depressive disorder, single episode, unspecified: Secondary | ICD-10-CM

## 2017-01-30 DIAGNOSIS — Z8619 Personal history of other infectious and parasitic diseases: Secondary | ICD-10-CM | POA: Diagnosis present

## 2017-01-30 MED ORDER — NITROFURANTOIN MONOHYD MACRO 100 MG PO CAPS: 100.0000 mg | ORAL_CAPSULE | Freq: Two times a day (BID) | ORAL | 2 refills | Status: DC

## 2017-01-30 NOTE — Progress Notes (Signed)
Patient is in the office for Alisha Wall, reports that she thinks she has a UTI.

## 2017-01-30 NOTE — Progress Notes (Signed)
Patient ID: Alisha Wall, female   DOB: 12/22/1990, 26 y.o.   MRN: [id number]  Chief Complaint  Patient presents with  . Routine Prenatal Visit    HPI Alisha Wall is a 26 y.o. female.  History of positive chlamydia infection, treated.  Presents today for TOC cultures.  She also feel that she has UTI.  HPI  Past Medical History:  Diagnosis Date  . Anxiety   . Depression   . UTI (lower urinary tract infection)     Past Surgical History:  Procedure Laterality Date  . INDUCED ABORTION      Family History  Problem Relation Age of Onset  . Alcohol abuse Neg Hx   . Arthritis Neg Hx   . Asthma Neg Hx   . Cancer Neg Hx   . Birth defects Neg Hx   . COPD Neg Hx   . Depression Neg Hx   . Diabetes Neg Hx   . Drug abuse Neg Hx   . Early death Neg Hx   . Hearing loss Neg Hx   . Heart disease Neg Hx   . Hyperlipidemia Neg Hx   . Hypertension Neg Hx   . Kidney disease Neg Hx   . Learning disabilities Neg Hx   . Mental illness Neg Hx   . Mental retardation Neg Hx   . Miscarriages / Stillbirths Neg Hx   . Stroke Neg Hx   . Vision loss Neg Hx   . Varicose Veins Neg Hx     Social History Social History  Substance Use Topics  . Smoking status: Never Smoker  . Smokeless tobacco: Never Used  . Alcohol use No    No Known Allergies  Current Outpatient Prescriptions  Medication Sig Dispense Refill  . azithromycin (ZITHROMAX) 500 MG tablet Take 2 tablets (1,000 mg total) by mouth daily. (Patient not taking: Reported on 01/30/2017) 2 tablet 0  . butalbital-acetaminophen-caffeine (FIORICET, ESGIC) 50-325-40 MG tablet Take 1-2 tablets by mouth every 6 (six) hours as needed for headache. (Patient not taking: Reported on 08/16/2016) 30 tablet 2  . cefixime (SUPRAX) 400 MG tablet Take 1 tablet (400 mg total) by mouth daily. (Patient not taking: Reported on 01/30/2017) 1 tablet 0  . ibuprofen (ADVIL,MOTRIN) 600 MG tablet Take 1 tablet (600 mg total) by mouth every 6 (six) hours as  needed for mild pain or moderate pain. (Patient not taking: Reported on 01/30/2017) 30 tablet 5  . metroNIDAZOLE (FLAGYL) 500 MG tablet Take 1 tablet (500 mg total) by mouth 2 (two) times daily. (Patient not taking: Reported on 01/30/2017) 14 tablet 0  . nitrofurantoin, macrocrystal-monohydrate, (MACROBID) 100 MG capsule Take 1 capsule (100 mg total) by mouth 2 (two) times daily. 14 capsule 2  . omeprazole (PRILOSEC) 40 MG capsule Take 1 capsule (40 mg total) by mouth daily. (Patient not taking: Reported on 05/02/2016) 30 capsule 3  . oxyCODONE-acetaminophen (PERCOCET/ROXICET) 5-325 MG tablet Take 1-2 tablets by mouth every 4 (four) hours as needed for moderate pain or severe pain (pain scale > 7). (Patient not taking: Reported on 05/02/2016) 40 tablet 0  . penicillin v potassium (VEETID) 500 MG tablet Take 1 tablet (500 mg total) by mouth 3 (three) times daily. (Patient not taking: Reported on 01/30/2017) 30 tablet 0  . Prenatal Vit-Fe Fumarate-FA (PRENATAL MULTIVITAMIN) TABS tablet Take 1 tablet by mouth daily at 12 noon.     Current Facility-Administered Medications  Medication Dose Route Frequency Provider Last Rate Last Dose  . cefTRIAXone (  ROCEPHIN) injection 250 mg  250 mg Intramuscular Once Brock BadHarper, Josha Weekley A, MD        Review of Systems Review of Systems Constitutional: negative for fatigue and weight loss Respiratory: negative for cough and wheezing Cardiovascular: negative for chest pain, fatigue and palpitations Gastrointestinal: negative for abdominal pain and change in bowel habits Genitourinary:positive for dribbling with urination Integument/breast: negative for nipple discharge Musculoskeletal:negative for myalgias Neurological: negative for gait problems and tremors Behavioral/Psych: negative for abusive relationship, depression Endocrine: negative for temperature intolerance      not currently breastfeeding.  Physical Exam Physical Exam:   PE;            General;  Alert  and no distress          Abdomen:  Soft, NT          Pelvic:  NEFG                       Vagina - normal mucosa                       Cervix - normal mucosa  A/P:  History of positive chlamydia, treated.          TOC cultures done          Follow up for Annual and Pap  50% of 15 min visit spent on counseling and coordination of care.    Data Reviewed Cultures  Assessment and Plan:    1. History of chlamydia Rx: - Cervicovaginal ancillary only  2. Reactive depression Rx: - Ambulatory referral to Integrated Behavioral Health  3. UTI symptoms Rx: - POCT Urinalysis Dipstick - nitrofurantoin, macrocrystal-monohydrate, (MACROBID) 100 MG capsule; Take 1 capsule (100 mg total) by mouth 2 (two) times daily.  Dispense: 14 capsule; Refill: 2    Plan    Follow up in 8 months for Annual and Pap  Orders Placed This Encounter  Procedures  . Ambulatory referral to Integrated Behavioral Health    Referral Priority:   Routine    Referral Type:   Consultation    Referral Reason:   Specialty Services Required    Requested Specialty:   Licensed Clinical Social Worker    Number of Visits Requested:   1  . POCT Urinalysis Dipstick   Meds ordered this encounter  Medications  . nitrofurantoin, macrocrystal-monohydrate, (MACROBID) 100 MG capsule    Sig: Take 1 capsule (100 mg total) by mouth 2 (two) times daily.    Dispense:  14 capsule    Refill:  2

## 2017-02-01 ENCOUNTER — Telehealth: Payer: Self-pay

## 2017-02-01 ENCOUNTER — Other Ambulatory Visit: Payer: Self-pay | Admitting: Obstetrics

## 2017-02-01 DIAGNOSIS — N760 Acute vaginitis: Principal | ICD-10-CM

## 2017-02-01 DIAGNOSIS — B9689 Other specified bacterial agents as the cause of diseases classified elsewhere: Secondary | ICD-10-CM

## 2017-02-01 LAB — CERVICOVAGINAL ANCILLARY ONLY
Bacterial vaginitis: POSITIVE — AB
CHLAMYDIA, DNA PROBE: NEGATIVE
Candida vaginitis: NEGATIVE
Neisseria Gonorrhea: NEGATIVE
Trichomonas: NEGATIVE

## 2017-02-01 MED ORDER — METRONIDAZOLE 500 MG PO TABS: 500.0000 mg | ORAL_TABLET | Freq: Two times a day (BID) | ORAL | 2 refills | Status: DC

## 2017-02-01 NOTE — Telephone Encounter (Signed)
Called patient to schedule with counselor Marijean NiemannJaime but voice mail is full could not leave a message.

## 2017-02-20 ENCOUNTER — Encounter (HOSPITAL_COMMUNITY): Payer: Self-pay

## 2017-02-20 ENCOUNTER — Emergency Department (HOSPITAL_COMMUNITY)
Admission: EM | Admit: 2017-02-20 | Discharge: 2017-02-20 | Disposition: A | Discharge status: Home / Self Care | Payer: Medicaid Other | Attending: Emergency Medicine | Admitting: Emergency Medicine

## 2017-02-20 ENCOUNTER — Emergency Department (HOSPITAL_COMMUNITY): Payer: Medicaid Other

## 2017-02-20 DIAGNOSIS — Y92410 Unspecified street and highway as the place of occurrence of the external cause: Secondary | ICD-10-CM | POA: Diagnosis not present

## 2017-02-20 DIAGNOSIS — S63501A Unspecified sprain of right wrist, initial encounter: Secondary | ICD-10-CM | POA: Diagnosis not present

## 2017-02-20 DIAGNOSIS — Y939 Activity, unspecified: Secondary | ICD-10-CM | POA: Diagnosis not present

## 2017-02-20 DIAGNOSIS — Y999 Unspecified external cause status: Secondary | ICD-10-CM | POA: Insufficient documentation

## 2017-02-20 DIAGNOSIS — Z79899 Other long term (current) drug therapy: Secondary | ICD-10-CM | POA: Diagnosis not present

## 2017-02-20 DIAGNOSIS — S6991XA Unspecified injury of right wrist, hand and finger(s), initial encounter: Secondary | ICD-10-CM | POA: Diagnosis present

## 2017-02-20 MED ORDER — NAPROXEN 375 MG PO TABS: 375.0000 mg | ORAL_TABLET | Freq: Two times a day (BID) | ORAL | 0 refills | Status: DC
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
## 2017-02-26 ENCOUNTER — Telehealth: Payer: Self-pay | Admitting: *Deleted

## 2017-02-26 NOTE — Telephone Encounter (Signed)
Pt called to office with question about refill on antibiotics.  Return call to pt. Pt made aware that she should have refills on file with pharmacy. Pt advised to call office if any problems getting refill.

## 2017-05-18 ENCOUNTER — Ambulatory Visit: Payer: Medicaid Other | Admitting: Obstetrics

## 2017-05-24 ENCOUNTER — Ambulatory Visit: Payer: Medicaid Other | Admitting: Obstetrics

## 2017-05-30 ENCOUNTER — Other Ambulatory Visit (HOSPITAL_COMMUNITY)
Admission: RE | Admit: 2017-05-30 | Discharge: 2017-05-30 | Disposition: A | Discharge status: Home / Self Care | Payer: Medicaid Other | Source: Ambulatory Visit | Attending: Obstetrics | Admitting: Obstetrics

## 2017-05-30 ENCOUNTER — Encounter: Payer: Self-pay | Admitting: Obstetrics

## 2017-05-30 ENCOUNTER — Ambulatory Visit: Payer: Medicaid Other | Admitting: Obstetrics

## 2017-05-30 VITALS — BP 107/69 | HR 70 | Wt 148.0 lb

## 2017-05-30 DIAGNOSIS — Z0000 Encounter for general adult medical examination without abnormal findings: Secondary | ICD-10-CM | POA: Insufficient documentation

## 2017-05-30 DIAGNOSIS — H547 Unspecified visual loss: Secondary | ICD-10-CM | POA: Diagnosis not present

## 2017-05-30 DIAGNOSIS — N898 Other specified noninflammatory disorders of vagina: Secondary | ICD-10-CM

## 2017-05-30 DIAGNOSIS — L0232 Furuncle of buttock: Secondary | ICD-10-CM

## 2017-05-30 DIAGNOSIS — Z113 Encounter for screening for infections with a predominantly sexual mode of transmission: Secondary | ICD-10-CM

## 2017-05-30 MED ORDER — CLINDAMYCIN HCL 300 MG PO CAPS: 300.0000 mg | ORAL_CAPSULE | Freq: Three times a day (TID) | ORAL | 2 refills | Status: DC

## 2017-05-30 MED ORDER — PRENATE PIXIE 10-0.6-0.4-200 MG PO CAPS: 1.0000 | ORAL_CAPSULE | Freq: Every day | ORAL | 11 refills | Status: AC

## 2017-05-30 NOTE — Progress Notes (Signed)
Patient ID: Alisha Darbyiffany J Ginger, female   DOB: 1991/05/27, 26 y.o.   MRN: [id number]  Chief Complaint  Patient presents with  . STD Testing    Pt requesting STD testing.  She reports there is condom stuck in her vagina as well that she would like removed.  . Recurrent Skin Infections    Pt c/o recurrent boils on buttock. She denies current infection.    HPI Alisha Wall is a 26 y.o. female.  Has malodorous vaginal discharge.  Suspects condom left in vagina.  Has also had recurrent boils on buttocks.  HPI  Past Medical History:  Diagnosis Date  . Anxiety   . Depression   . UTI (lower urinary tract infection)     Past Surgical History:  Procedure Laterality Date  . INDUCED ABORTION      Family History  Problem Relation Age of Onset  . Alcohol abuse Neg Hx   . Arthritis Neg Hx   . Asthma Neg Hx   . Cancer Neg Hx   . Birth defects Neg Hx   . COPD Neg Hx   . Depression Neg Hx   . Diabetes Neg Hx   . Drug abuse Neg Hx   . Early death Neg Hx   . Hearing loss Neg Hx   . Heart disease Neg Hx   . Hyperlipidemia Neg Hx   . Hypertension Neg Hx   . Kidney disease Neg Hx   . Learning disabilities Neg Hx   . Mental illness Neg Hx   . Mental retardation Neg Hx   . Miscarriages / Stillbirths Neg Hx   . Stroke Neg Hx   . Vision loss Neg Hx   . Varicose Veins Neg Hx     Social History Social History   Tobacco Use  . Smoking status: Never Smoker  . Smokeless tobacco: Never Used  Substance Use Topics  . Alcohol use: No  . Drug use: No    No Known Allergies  Current Outpatient Medications  Medication Sig Dispense Refill  . ibuprofen (ADVIL,MOTRIN) 600 MG tablet Take 1 tablet (600 mg total) by mouth every 6 (six) hours as needed for mild pain or moderate pain. 30 tablet 5  . naproxen (NAPROSYN) 375 MG tablet Take 1 tablet (375 mg total) by mouth 2 (two) times daily. 20 tablet 0  . Prenatal Vit-Fe Fumarate-FA (PRENATAL MULTIVITAMIN) TABS tablet Take 1 tablet by mouth daily  at 12 noon.    . clindamycin (CLEOCIN) 300 MG capsule Take 1 capsule (300 mg total) by mouth 3 (three) times daily. 30 capsule 2  . nitrofurantoin, macrocrystal-monohydrate, (MACROBID) 100 MG capsule Take 1 capsule (100 mg total) by mouth 2 (two) times daily. 14 capsule 2  . Prenat-FeAsp-Meth-FA-DHA w/o A (PRENATE PIXIE) 10-0.6-0.4-200 MG CAPS Take 1 capsule by mouth daily before breakfast. 30 capsule 11   Current Facility-Administered Medications  Medication Dose Route Frequency Provider Last Rate Last Dose  . cefTRIAXone (ROCEPHIN) injection 250 mg  250 mg Intramuscular Once Brock BadHarper, Charles A, MD        Review of Systems Review of Systems Constitutional: negative for fatigue and weight loss Respiratory: negative for cough and wheezing Cardiovascular: negative for chest pain, fatigue and palpitations Gastrointestinal: negative for abdominal pain and change in bowel habits Genitourinary:positive for vaginal discharge Integument/breast: positive for boils on buttocks Musculoskeletal:negative for myalgias Neurological: negative for gait problems and tremors Behavioral/Psych: negative for abusive relationship, depression Endocrine: negative for temperature intolerance  Blood pressure 107/69, pulse 70, weight 148 lb (67.1 kg), not currently breastfeeding.  Physical Exam Physical Exam           General:  Alert and no distress Abdomen:  normal findings: no organomegaly, soft, non-tender and no hernia  Pelvis:  External genitalia: normal general appearance Urinary system: urethral meatus normal and bladder without fullness, nontender Vaginal: normal without tenderness, induration or masses Cervix: normal appearance Adnexa: normal bimanual exam Uterus: anteverted and non-tender, normal size    50% of 15 min visit spent on counseling and coordination of care.   Data Reviewed Wet prep  Assessment and Plan:    1. Vaginal discharge Rx: - Cervicovaginal ancillary only  2.  Decreased visual acuity Rx: - Ambulatory referral to Ophthalmology  3. Boil of buttock Rx: - clindamycin (CLEOCIN) 300 MG capsule; Take 1 capsule (300 mg total) by mouth 3 (three) times daily.  Dispense: 30 capsule; Refill: 2  4. Screening for STD (sexually transmitted disease) Rx: - Hepatitis B surface antigen - Hepatitis C antibody - HIV antibody - RPR  5. Routine adult health maintenance Rx - Prenat-FeAsp-Meth-FA-DHA w/o A (PRENATE PIXIE) 10-0.6-0.4-200 MG CAPS; Take 1 capsule by mouth daily before breakfast.  Dispense: 30 capsule; Refill: 11     Plan    Follow up in 5 months for Annual and Pap  Orders Placed This Encounter  Procedures  . Ambulatory referral to Ophthalmology    Referral Priority:   Routine    Referral Type:   Consultation    Referral Reason:   Specialty Services Required    Requested Specialty:   Ophthalmology    Number of Visits Requested:   1   Meds ordered this encounter  Medications  . clindamycin (CLEOCIN) 300 MG capsule    Sig: Take 1 capsule (300 mg total) by mouth 3 (three) times daily.    Dispense:  30 capsule    Refill:  2  . Prenat-FeAsp-Meth-FA-DHA w/o A (PRENATE PIXIE) 10-0.6-0.4-200 MG CAPS    Sig: Take 1 capsule by mouth daily before breakfast.    Dispense:  30 capsule    Refill:  11

## 2017-05-31 ENCOUNTER — Encounter: Payer: Self-pay | Admitting: Obstetrics

## 2017-05-31 LAB — RPR: RPR: NONREACTIVE

## 2017-05-31 LAB — HEPATITIS B SURFACE ANTIGEN: Hepatitis B Surface Ag: NEGATIVE

## 2017-05-31 LAB — HEPATITIS C ANTIBODY: Hep C Virus Ab: <0.1 s/co ratio (ref 0.0–0.9)

## 2017-05-31 LAB — CERVICOVAGINAL ANCILLARY ONLY
BACTERIAL VAGINITIS: NEGATIVE
Candida vaginitis: POSITIVE — AB
Chlamydia: NEGATIVE
Neisseria Gonorrhea: NEGATIVE
Trichomonas: NEGATIVE

## 2017-05-31 LAB — HIV ANTIBODY (ROUTINE TESTING W REFLEX): HIV SCREEN 4TH GENERATION: NONREACTIVE

## 2017-06-01 ENCOUNTER — Other Ambulatory Visit: Payer: Self-pay | Admitting: Obstetrics

## 2017-06-01 DIAGNOSIS — B373 Candidiasis of vulva and vagina: Secondary | ICD-10-CM

## 2017-06-01 DIAGNOSIS — B3731 Acute candidiasis of vulva and vagina: Secondary | ICD-10-CM

## 2017-06-01 MED ORDER — FLUCONAZOLE 150 MG PO TABS: 150.0000 mg | ORAL_TABLET | Freq: Once | ORAL | 0 refills | Status: AC

## 2017-08-30 ENCOUNTER — Inpatient Hospital Stay (HOSPITAL_COMMUNITY)
Admission: AD | Admit: 2017-08-30 | Discharge: 2017-08-31 | Disposition: A | Discharge status: Home / Self Care | Payer: Self-pay | Source: Ambulatory Visit | Attending: Obstetrics and Gynecology | Admitting: Obstetrics and Gynecology

## 2017-08-30 DIAGNOSIS — N911 Secondary amenorrhea: Secondary | ICD-10-CM | POA: Insufficient documentation

## 2017-08-30 DIAGNOSIS — N926 Irregular menstruation, unspecified: Secondary | ICD-10-CM | POA: Insufficient documentation

## 2017-08-30 DIAGNOSIS — F419 Anxiety disorder, unspecified: Secondary | ICD-10-CM | POA: Insufficient documentation

## 2017-08-30 DIAGNOSIS — F329 Major depressive disorder, single episode, unspecified: Secondary | ICD-10-CM | POA: Insufficient documentation

## 2017-08-30 DIAGNOSIS — Z79899 Other long term (current) drug therapy: Secondary | ICD-10-CM | POA: Insufficient documentation

## 2017-08-30 LAB — URINALYSIS, ROUTINE W REFLEX MICROSCOPIC
Bilirubin Urine: NEGATIVE
Glucose, UA: NEGATIVE mg/dL
KETONES UR: NEGATIVE mg/dL
NITRITE: NEGATIVE
PH: 9 — AB (ref 5.0–8.0)
Protein, ur: NEGATIVE mg/dL
SPECIFIC GRAVITY, URINE: 1.005 (ref 1.005–1.030)

## 2017-08-30 LAB — POCT PREGNANCY, URINE: Preg Test, Ur: NEGATIVE

## 2017-08-30 NOTE — MAU Note (Signed)
Having pregnancy symptoms (morning nausea, missed period, back pain, vomited twice at night, increased appetite) for the past 1.5 months.  Had an appointment with Dr. Clearance CootsHarper yesterday to confirm pregnancy-missed her appointment.  Having back pain and foot pain.  Has not taken a HPT.  LMP 06/24/2017

## 2017-08-30 NOTE — MAU Provider Note (Signed)
Chief Complaint:  Vaginal Bleeding   Provider saw patient at    HPI: Alisha Wall is a 27 y.o. [id number] who presents to maternity admissions reporting pregnancy symptoms and missed periods.  Does have a history of missed period in past.  Missed her appt with Dr Clearance CootsHarper.  Did not take UPT at home.  Doesn't know why. No c/o otherwise.  She reports new  vaginal bleeding, but no vaginal itching/burning, urinary symptoms, h/a, dizziness, n/v, or fever/chills.    Vaginal Bleeding  The patient's primary symptoms include pelvic pain and vaginal bleeding. The patient's pertinent negatives include no genital itching, genital lesions or genital odor. The current episode started yesterday. The pain is mild. She is not pregnant. Pertinent negatives include no constipation, diarrhea, dysuria or headaches. The vaginal discharge was bloody. The vaginal bleeding is lighter than menses. Nothing aggravates the symptoms. She has tried nothing for the symptoms. She is sexually active.    RN Note: Having pregnancy symptoms (morning nausea, missed period, back pain, vomited twice at night, increased appetite) for the past 1.5 months.  Had an appointment with Dr. Clearance CootsHarper yesterday to confirm pregnancy-missed her appointment.  Having back pain and foot pain.  Has not taken a HPT.  LMP 06/24/2017    Past Medical History: Past Medical History:  Diagnosis Date  . Anxiety   . Depression   . UTI (lower urinary tract infection)     Past obstetric history: OB History  Gravida Para Term Preterm AB Living  5 3 3   2 3   SAB TAB Ectopic Multiple Live Births  1 1   0 3    # Outcome Date GA Lbr Len/2nd Weight Sex Delivery Anes PTL Lv  5 Term 11/26/15 [id number] 10:04 / 00:20 7 lb 12.2 oz (3.52 kg) M Vag-Spont EPI  LIV  4 Term 12/22/11    M Vag-Spont EPI  LIV  3 Term 11/25/10    M Vag-Spont EPI  LIV  2 TAB 2011          1 SAB               Past Surgical History: Past Surgical History:  Procedure Laterality Date  .  INDUCED ABORTION      Family History: Family History  Problem Relation Age of Onset  . Alcohol abuse Neg Hx   . Arthritis Neg Hx   . Asthma Neg Hx   . Cancer Neg Hx   . Birth defects Neg Hx   . COPD Neg Hx   . Depression Neg Hx   . Diabetes Neg Hx   . Drug abuse Neg Hx   . Early death Neg Hx   . Hearing loss Neg Hx   . Heart disease Neg Hx   . Hyperlipidemia Neg Hx   . Hypertension Neg Hx   . Kidney disease Neg Hx   . Learning disabilities Neg Hx   . Mental illness Neg Hx   . Mental retardation Neg Hx   . Miscarriages / Stillbirths Neg Hx   . Stroke Neg Hx   . Vision loss Neg Hx   . Varicose Veins Neg Hx     Social History: Social History   Tobacco Use  . Smoking status: Never Smoker  . Smokeless tobacco: Never Used  Substance Use Topics  . Alcohol use: No  . Drug use: No    Allergies: No Known Allergies  Meds:  Facility-Administered Medications Prior to Admission  Medication Dose Route Frequency  Provider Last Rate Last Dose  . cefTRIAXone (ROCEPHIN) injection 250 mg  250 mg Intramuscular Once Brock Bad, MD       Medications Prior to Admission  Medication Sig Dispense Refill Last Dose  . clindamycin (CLEOCIN) 300 MG capsule Take 1 capsule (300 mg total) by mouth 3 (three) times daily. 30 capsule 2   . ibuprofen (ADVIL,MOTRIN) 600 MG tablet Take 1 tablet (600 mg total) by mouth every 6 (six) hours as needed for mild pain or moderate pain. 30 tablet 5 Taking  . naproxen (NAPROSYN) 375 MG tablet Take 1 tablet (375 mg total) by mouth 2 (two) times daily. 20 tablet 0 Taking  . nitrofurantoin, macrocrystal-monohydrate, (MACROBID) 100 MG capsule Take 1 capsule (100 mg total) by mouth 2 (two) times daily. 14 capsule 2   . Prenat-FeAsp-Meth-FA-DHA w/o A (PRENATE PIXIE) 10-0.6-0.4-200 MG CAPS Take 1 capsule by mouth daily before breakfast. 30 capsule 11   . Prenatal Vit-Fe Fumarate-FA (PRENATAL MULTIVITAMIN) TABS tablet Take 1 tablet by mouth daily at 12 noon.    Taking    I have reviewed patient's Past Medical Hx, Surgical Hx, Family Hx, Social Hx, medications and allergies.  ROS:  Review of Systems  Gastrointestinal: Negative for constipation and diarrhea.  Genitourinary: Positive for pelvic pain and vaginal bleeding. Negative for dysuria.  Neurological: Negative for headaches.   Other systems negative     Physical Exam   Patient Vitals for the past 24 hrs:  BP Temp Temp src Pulse Resp Height Weight  08/30/17 2204 (!) 101/59 97.6 F (36.4 C) - 65 - - -  08/30/17 2203 - - Oral - 19 5\' 6"  (1.676 m) 158 lb 12 oz (72 kg)   Constitutional: Well-developed, well-nourished female in no acute distress.  Cardiovascular: normal rate and rhythm, no ectopy audible, S1 & S2 heard, no murmur Respiratory: normal effort, no distress. Lungs CTAB with no wheezes or crackles GI: Abd soft, non-tender.  Nondistended.  No rebound, No guarding.  Bowel Sounds audible  MS: Extremities nontender, no edema, normal ROM Neurologic: Alert and oriented x 4.   Grossly nonfocal. GU: Neg CVAT. Skin:  Warm and Dry Psych:  Affect appropriate.  PELVIC EXAM: Small bloody discharge, vaginal walls and external genitalia normal    Labs: Results for orders placed or performed during the hospital encounter of 08/30/17 (from the past 24 hour(s))  Urinalysis, Routine w reflex microscopic     Status: Abnormal   Collection Time: 08/30/17 10:05 PM  Result Value Ref Range   Color, Urine STRAW (A) YELLOW   APPearance CLEAR CLEAR   Specific Gravity, Urine 1.005 1.005 - 1.030   pH 9.0 (H) 5.0 - 8.0   Glucose, UA NEGATIVE NEGATIVE mg/dL   Hgb urine dipstick LARGE (A) NEGATIVE   Bilirubin Urine NEGATIVE NEGATIVE   Ketones, ur NEGATIVE NEGATIVE mg/dL   Protein, ur NEGATIVE NEGATIVE mg/dL   Nitrite NEGATIVE NEGATIVE   Leukocytes, UA MODERATE (A) NEGATIVE   RBC / HPF TOO NUMEROUS TO COUNT 0 - 5 RBC/hpf   WBC, UA 6-30 0 - 5 WBC/hpf   Bacteria, UA RARE (A) NONE SEEN   Squamous  Epithelial / LPF 0-5 (A) NONE SEEN  Pregnancy, urine POC     Status: None   Collection Time: 08/30/17 10:37 PM  Result Value Ref Range   Preg Test, Ur NEGATIVE NEGATIVE      Imaging:  No results found.  MAU Course/MDM: I have ordered labs as follows: see above.  UPT negative Imaging ordered: none indicated Results reviewed. Discussed this lapse was likely due to anovulation.   Treatments in MAU included none.   Pt stable at time of discharge.  Assessment: Secondary amenorrhea - Plan: Discharge patient  Menses, irregular - Plan: Discharge patient    Plan: Discharge home Recommend chart menstrual cycles Continue PR Vitamins (wants to conceive)  Encouraged to return here or to other Urgent Care/ED if she develops worsening of symptoms, increase in pain, fever, or other concerning symptoms.   Wynelle Bourgeois CNM, MSN Certified Nurse-Midwife 08/30/2017 10:58 PM

## 2017-08-31 ENCOUNTER — Encounter (HOSPITAL_COMMUNITY): Payer: Self-pay | Admitting: *Deleted

## 2017-08-31 DIAGNOSIS — N926 Irregular menstruation, unspecified: Secondary | ICD-10-CM

## 2017-08-31 NOTE — Discharge Instructions (Signed)
Dysfunctional Uterine Bleeding °Dysfunctional uterine bleeding is abnormal bleeding from the uterus. Dysfunctional uterine bleeding includes: °· A period that comes earlier or later than usual. °· A period that is lighter, heavier, or has blood clots. °· Bleeding between periods. °· Skipping one or more periods. °· Bleeding after sexual intercourse. °· Bleeding after menopause. ° °Follow these instructions at home: °Pay attention to any changes in your symptoms. Follow these instructions to help with your condition: °Eating and drinking °· Eat well-balanced meals. Include foods that are high in iron, such as liver, meat, shellfish, green leafy vegetables, and eggs. °· If you become constipated: °? Drink plenty of water. °? Eat fruits and vegetables that are high in water and fiber, such as spinach, carrots, raspberries, apples, and mango. °Medicines °· Take over-the-counter and prescription medicines only as told by your health care provider. °· Do not change medicines without talking with your health care provider. °· Aspirin or medicines that contain aspirin may make the bleeding worse. Do not take those medicines: °? During the week before your period. °? During your period. °· If you were prescribed iron pills, take them as told by your health care provider. Iron pills help to replace iron that your body loses because of this condition. °Activity °· If you need to change your sanitary pad or tampon more than one time every 2 hours: °? Lie in bed with your feet raised (elevated). °? Place a cold pack on your lower abdomen. °? Rest as much as possible until the bleeding stops or slows down. °· Do not try to lose weight until the bleeding has stopped and your blood iron level is back to normal. °Other Instructions °· For two months, write down: °? When your period starts. °? When your period ends. °? When any abnormal bleeding occurs. °? What problems you notice. °· Keep all follow up visits as told by your health  care provider. This is important. °Contact a health care provider if: °· You get light-headed or weak. °· You have nausea and vomiting. °· You cannot eat or drink without vomiting. °· You feel dizzy or have diarrhea while you are taking medicines. °· You are taking birth control pills or hormones, and you want to change them or stop taking them. °Get help right away if: °· You develop a fever or chills. °· You need to change your sanitary pad or tampon more than one time per hour. °· Your bleeding becomes heavier, or your flow contains clots more often. °· You develop pain in your abdomen. °· You lose consciousness. °· You develop a rash. °This information is not intended to replace advice given to you by your health care provider. Make sure you discuss any questions you have with your health care provider. °Document Released: 06/02/2000 Document Revised: 11/11/2015 Document Reviewed: 08/31/2014 °Elsevier Interactive Patient Education © 2018 [employer] ° °

## 2017-09-19 ENCOUNTER — Encounter (HOSPITAL_COMMUNITY): Payer: Self-pay | Admitting: Radiology

## 2017-09-19 ENCOUNTER — Other Ambulatory Visit: Payer: Self-pay

## 2017-09-19 ENCOUNTER — Emergency Department (HOSPITAL_COMMUNITY)
Admission: EM | Admit: 2017-09-19 | Discharge: 2017-09-20 | Disposition: A | Discharge status: Home / Self Care | Payer: Self-pay | Attending: Emergency Medicine | Admitting: Emergency Medicine

## 2017-09-19 ENCOUNTER — Emergency Department (HOSPITAL_COMMUNITY): Payer: Self-pay

## 2017-09-19 DIAGNOSIS — N390 Urinary tract infection, site not specified: Secondary | ICD-10-CM | POA: Insufficient documentation

## 2017-09-19 DIAGNOSIS — R109 Unspecified abdominal pain: Secondary | ICD-10-CM

## 2017-09-19 DIAGNOSIS — R102 Pelvic and perineal pain: Secondary | ICD-10-CM | POA: Insufficient documentation

## 2017-09-19 DIAGNOSIS — R319 Hematuria, unspecified: Secondary | ICD-10-CM | POA: Insufficient documentation

## 2017-09-19 DIAGNOSIS — R110 Nausea: Secondary | ICD-10-CM | POA: Insufficient documentation

## 2017-09-19 LAB — COMPREHENSIVE METABOLIC PANEL
ALBUMIN: 4.3 g/dL (ref 3.5–5.0)
ALK PHOS: 71 U/L (ref 38–126)
ALT: 14 U/L (ref 14–54)
AST: 17 U/L (ref 15–41)
Anion gap: 8 (ref 5–15)
BILIRUBIN TOTAL: 0.9 mg/dL (ref 0.3–1.2)
BUN: 12 mg/dL (ref 6–20)
CO2: 26 mmol/L (ref 22–32)
Calcium: 9.4 mg/dL (ref 8.9–10.3)
Chloride: 108 mmol/L (ref 101–111)
Creatinine, Ser: 0.68 mg/dL (ref 0.44–1.00)
GFR calc Af Amer: >60 mL/min (ref >60)
GFR calc non Af Amer: >60 mL/min (ref >60)
GLUCOSE: 78 mg/dL (ref 65–99)
POTASSIUM: 3.2 mmol/L — AB (ref 3.5–5.1)
Sodium: 142 mmol/L (ref 135–145)
TOTAL PROTEIN: 8.6 g/dL — AB (ref 6.5–8.1)

## 2017-09-19 LAB — CBC
HEMATOCRIT: 38.7 % (ref 36.0–46.0)
Hemoglobin: 13.2 g/dL (ref 12.0–15.0)
MCH: 28.9 pg (ref 26.0–34.0)
MCHC: 34.1 g/dL (ref 30.0–36.0)
MCV: 84.7 fL (ref 78.0–100.0)
Platelets: 431 10*3/uL — ABNORMAL HIGH (ref 150–400)
RBC: 4.57 MIL/uL (ref 3.87–5.11)
RDW: 12.5 % (ref 11.5–15.5)
WBC: 7.9 10*3/uL (ref 4.0–10.5)

## 2017-09-19 LAB — I-STAT BETA HCG BLOOD, ED (MC, WL, AP ONLY)

## 2017-09-19 LAB — URINALYSIS, ROUTINE W REFLEX MICROSCOPIC
Bilirubin Urine: NEGATIVE
Glucose, UA: NEGATIVE mg/dL
Ketones, ur: NEGATIVE mg/dL
NITRITE: NEGATIVE
Protein, ur: >=300 mg/dL — AB
Specific Gravity, Urine: 1.013 (ref 1.005–1.030)
Squamous Epithelial / LPF: NONE SEEN
pH: 7 (ref 5.0–8.0)

## 2017-09-19 LAB — LIPASE, BLOOD: Lipase: 57 U/L — ABNORMAL HIGH (ref 11–51)

## 2017-09-19 LAB — RAPID HIV SCREEN (HIV 1/2 AB+AG)
HIV 1/2 Antibodies: NONREACTIVE
HIV-1 P24 ANTIGEN - HIV24: NONREACTIVE

## 2017-09-19 LAB — WET PREP, GENITAL
Sperm: NONE SEEN
TRICH WET PREP: NONE SEEN
YEAST WET PREP: NONE SEEN

## 2017-09-19 MED ORDER — MORPHINE SULFATE (PF) 2 MG/ML IV SOLN
2.0000 mg | Freq: Once | INTRAVENOUS | Status: AC
  Administered 2017-09-19: 2 mg via INTRAVENOUS
  Filled 2017-09-19: qty 1

## 2017-09-19 MED ORDER — SODIUM CHLORIDE 0.9 % IV BOLUS
1000.0000 mL | Freq: Once | INTRAVENOUS | Status: AC
  Administered 2017-09-19: 1000 mL via INTRAVENOUS

## 2017-09-19 MED ORDER — SODIUM CHLORIDE 0.9 % IJ SOLN
INTRAMUSCULAR | Status: AC
  Filled 2017-09-19: qty 50

## 2017-09-19 MED ORDER — IOPAMIDOL (ISOVUE-300) INJECTION 61%
100.0000 mL | Freq: Once | INTRAVENOUS | Status: AC | PRN
  Administered 2017-09-19: 100 mL via INTRAVENOUS

## 2017-09-19 MED ORDER — IOPAMIDOL (ISOVUE-300) INJECTION 61%
INTRAVENOUS | Status: DC
Start: 2017-09-19 — End: 2017-09-20
  Filled 2017-09-19: qty 100

## 2017-09-19 NOTE — ED Notes (Signed)
No answer when called for lab work

## 2017-09-19 NOTE — ED Notes (Signed)
Lab called reported the specimen for gonorrhea and chlamydia are unsusceptible because their was no staff name on the specimen. Rene Kocheregina, [place] made [place] aware.

## 2017-09-19 NOTE — ED Notes (Signed)
Patient states she has been having this pain on and off for years. Patient states she feels like the area gets swollen. Pain can be described as a stabbing and shooting pain. Patient states she believes it may be her appendix. Patient states she's been worked up previously for this but they cannot find anything. Patient states she has nausea also and makes it difficult to eat. Patient states she eats well but also gets intermittent constipation. Patient states she attempted everything to get the pain to go away, patient states when she empties her bladder the pain is better.

## 2017-09-19 NOTE — ED Provider Notes (Signed)
[facility]-EMERGENCY DEPT Provider Note   CSN: [id number] Arrival date & time: 09/19/17  1351     History   Chief Complaint Chief Complaint  Patient presents with  . Abdominal Pain  . Urinary Retention    HPI Alisha Wall is a 27 y.o. female.  HPI   Pt is a 27 y/o female with a h/o anxiety, depression, UTI, STDs, who presents to the ED today c/o RLQ abd pain that has ongoing intermittently for years. States that 4 days ago pain began to worsen and then last night pain was most severe. States pain woke her up out of her sleep. States pain was 10/10 last night. Has improved to 8/10 after taking Motrin this AM. States pain feels like a stabbing pain. States pain radiates to right lower back. Pain is worse with certain movements.  She reports nausea, but denies fevers, vomiting, blood in stool, dysuria, hematuria, frequency. No vaginal discharge, irritation, or vaginal bleeding. Also reports recent constipation, but was able to have BM this AM. Has had decreased PO intake due to sxs. LMP was 3/12 and was WNL.   Past Medical History:  Diagnosis Date  . Anxiety   . Depression   . UTI (lower urinary tract infection)     Patient Active Problem List   Diagnosis Date Noted  . NSVD (normal spontaneous vaginal delivery) 11/27/2015  . Active labor at term 11/26/2015    Past Surgical History:  Procedure Laterality Date  . INDUCED ABORTION       OB History    Gravida  5   Para  3   Term  3   Preterm      AB  2   Living  3     SAB  1   TAB  1   Ectopic      Multiple  0   Live Births  3            Home Medications    Prior to Admission medications   Medication Sig Start Date End Date Taking? Authorizing Provider  ibuprofen (ADVIL,MOTRIN) 600 MG tablet Take 1 tablet (600 mg total) by mouth every 6 (six) hours as needed for mild pain or moderate pain. 12/06/16  Yes Fayrene Helper, PA-C  Prenat-FeAsp-Meth-FA-DHA w/o A (PRENATE PIXIE)  10-0.6-0.4-200 MG CAPS Take 1 capsule by mouth daily before breakfast. 05/30/17  Yes Brock Bad, MD  sulfamethoxazole-trimethoprim (BACTRIM DS,SEPTRA DS) 800-160 MG tablet Take 1 tablet by mouth 2 (two) times daily for 5 days. 09/20/17 09/25/17  Omeka Holben S, PA-C    Family History Family History  Problem Relation Age of Onset  . Alcohol abuse Neg Hx   . Arthritis Neg Hx   . Asthma Neg Hx   . Cancer Neg Hx   . Birth defects Neg Hx   . COPD Neg Hx   . Depression Neg Hx   . Diabetes Neg Hx   . Drug abuse Neg Hx   . Early death Neg Hx   . Hearing loss Neg Hx   . Heart disease Neg Hx   . Hyperlipidemia Neg Hx   . Hypertension Neg Hx   . Kidney disease Neg Hx   . Learning disabilities Neg Hx   . Mental illness Neg Hx   . Mental retardation Neg Hx   . Miscarriages / Stillbirths Neg Hx   . Stroke Neg Hx   . Vision loss Neg Hx   . Varicose Veins Neg  Hx     Social History Social History   Tobacco Use  . Smoking status: Never Smoker  . Smokeless tobacco: Never Used  Substance Use Topics  . Alcohol use: No    Comment: occ wine (rare)   . Drug use: No     Allergies   Patient has no known allergies.   Review of Systems Review of Systems  Constitutional: Negative for fever.  HENT: Negative for ear pain and sore throat.   Eyes: Negative for visual disturbance.  Respiratory: Negative for shortness of breath.   Cardiovascular: Negative for chest pain.  Gastrointestinal: Positive for abdominal pain and nausea. Negative for blood in stool, constipation, diarrhea and vomiting.  Genitourinary: Positive for pelvic pain (right). Negative for dysuria, flank pain, frequency, hematuria, urgency, vaginal bleeding and vaginal discharge.  Musculoskeletal: Positive for back pain. Negative for myalgias.  Skin: Negative for rash.  Neurological: Negative for dizziness, syncope, weakness, light-headedness, numbness and headaches.  All other systems reviewed and are  negative.    Physical Exam Updated Vital Signs BP 100/64 (BP Location: Left Arm)   Pulse 68   Temp 98.2 F (36.8 C) (Oral)   Resp 18   Ht 5\' 5"  (1.651 m)   Wt 71.7 kg (158 lb)   LMP 08/28/2017   SpO2 100%   BMI 26.29 kg/m   Physical Exam  Constitutional: She appears well-developed and well-nourished.  Non-toxic appearance. She does not appear ill. No distress.  HENT:  Head: Normocephalic and atraumatic.  Mouth/Throat: Oropharynx is clear and moist.  Eyes: Conjunctivae are normal.  Neck: Neck supple.  Cardiovascular: Normal rate, regular rhythm, normal heart sounds and intact distal pulses.  No murmur heard. Pulmonary/Chest: Effort normal and breath sounds normal. No respiratory distress.  Abdominal: Soft. Bowel sounds are normal. There is no rigidity, no rebound and no guarding.  ttp to RLQ and right pelvic pain, abd soft, active BS, right CVA ttp  Genitourinary:  Genitourinary Comments: Exam performed by Karrie Meres,  exam chaperoned Date: 09/20/2017 Pelvic exam: normal external genitalia without evidence of trauma. VULVA: normal appearing vulva with no masses, tenderness or lesion. VAGINA: normal appearing vagina with normal color and scant white discharge, no lesions. CERVIX: normal appearing cervix without lesions, cervical motion tenderness absent, cervical os closed with out purulent discharge; Wet prep and DNA probe for chlamydia and GC obtained.   ADNEXA: normal adnexa in size, nontender and no masses UTERUS: uterus is normal size, shape, consistency and its ttp  Musculoskeletal: She exhibits no edema.  Neurological: She is alert.  Skin: Skin is warm and dry. Capillary refill takes less than 2 seconds.  Psychiatric: She has a normal mood and affect.  Nursing note and vitals reviewed.    ED Treatments / Results  Labs (all labs ordered are listed, but only abnormal results are displayed) Labs Reviewed  WET PREP, GENITAL - Abnormal; Notable for the  following components:      Result Value   Clue Cells Wet Prep HPF POC PRESENT (*)    WBC, Wet Prep HPF POC FEW (*)    All other components within normal limits  LIPASE, BLOOD - Abnormal; Notable for the following components:   Lipase 57 (*)    All other components within normal limits  COMPREHENSIVE METABOLIC PANEL - Abnormal; Notable for the following components:   Potassium 3.2 (*)    Total Protein 8.6 (*)    All other components within normal limits  CBC - Abnormal; Notable for  the following components:   Platelets 431 (*)    All other components within normal limits  URINALYSIS, ROUTINE W REFLEX MICROSCOPIC - Abnormal; Notable for the following components:   APPearance TURBID (*)    Hgb urine dipstick MODERATE (*)    Protein, ur >=300 (*)    Leukocytes, UA LARGE (*)    Bacteria, UA FEW (*)    All other components within normal limits  URINE CULTURE  RAPID HIV SCREEN (HIV 1/2 AB+AG)  RPR  I-STAT BETA HCG BLOOD, ED ([facility], [facility], [facility] ONLY)    EKG None  Radiology Ct Abdomen Pelvis W Contrast  Result Date: 09/19/2017 CLINICAL DATA:  Intermittent right lower quadrant pain for 3 years. Nausea, vomiting constipation. History of urinary tract infection and duplicated RIGHT urinary collecting system. EXAM: CT ABDOMEN AND PELVIS WITH CONTRAST TECHNIQUE: Multidetector CT imaging of the abdomen and pelvis was performed using the standard protocol following bolus administration of intravenous contrast. CONTRAST:  100 cc ISOVUE-300 IOPAMIDOL (ISOVUE-300) INJECTION 61% COMPARISON:  CT abdomen and pelvis February 15, 2014 FINDINGS: LOWER CHEST: Lung bases are clear. Included heart size is normal. No pericardial effusion. HEPATOBILIARY: Liver and gallbladder are normal. PANCREAS: Normal. SPLEEN: Normal. ADRENALS/URINARY TRACT: Kidneys are orthotopic, demonstrating symmetric enhancement. Stable severe upper moiety hydronephrosis and cortical thinning. Persistently dilated RIGHT ureter, mild RIGHT  urothelial enhancement. No nephrolithiasis or renal mass. Punctate calcification within the LEFT upper pole parapelvic cysts. No nephrolithiasis, or solid renal masses. The unopacified ureters are normal in course and caliber. Delayed imaging through the kidneys demonstrates symmetric prompt contrast excretion within the proximal urinary collecting system. Urinary bladder is partially distended and unremarkable. Normal adrenal glands. STOMACH/BOWEL: The stomach, small and large bowel are normal in course and caliber without inflammatory changes. Normal appendix. VASCULAR/LYMPHATIC: Aortoiliac vessels are normal in course and caliber. No lymphadenopathy by CT size criteria. REPRODUCTIVE: Normal. OTHER: No intraperitoneal free fluid or free air. MUSCULOSKELETAL: Nonacute. IMPRESSION: 1. 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 2. Similar dilated RIGHT ureter, mild RIGHT urothelial enhancement concerning for superimposed urinary tract infection/pyelitis. Recommend urologic consultation. Electronically Signed   By: Awilda Metro M.D.   On: 09/19/2017 21:58    Procedures Procedures (including critical care time)  Medications Ordered in ED Medications  sodium chloride 0.9 % injection (has no administration in time range)  sodium chloride 0.9 % bolus 1,000 mL (0 mLs Intravenous Stopped 09/20/17 0052)  iopamidol (ISOVUE-300) 61 % injection 100 mL (100 mLs Intravenous Contrast Given 09/19/17 2126)  morphine 2 MG/ML injection 2 mg (2 mg Intravenous Given 09/19/17 2345)  cefTRIAXone (ROCEPHIN) injection 250 mg (250 mg Intramuscular Given 09/20/17 0051)  azithromycin (ZITHROMAX) powder 1 g (1 g Oral Given 09/20/17 0050)  sterile water (preservative free) injection (2.1 mLs  Given 09/20/17 0051)     Initial Impression / Assessment and Plan / ED Course  I have reviewed the triage vital signs and the nursing notes.  Pertinent labs & imaging results that were  available during my care of the patient were reviewed by me and considered in my medical decision making (see chart for details).  Consulted with Dr. Retta Diones from urology who recommended starting the patient on Bactrim and having her follow-up in the office tomorrow morning.  GC chlamydia was sent to lab without label on it, and unable to run test.  Will empirically treat patient for gonorrhea chlamydia given her history of STDs.  Do not suspect PID given no cervical motion tenderness.  Final Clinical Impressions(s) / ED Diagnoses   Final diagnoses:  Urinary tract infection with hematuria, site unspecified  Abdominal pain, unspecified abdominal location   Patient is nontoxic, nonseptic appearing, in no apparent distress.  Patient's pain and other symptoms adequately managed in emergency department.  Fluid bolus given.  Labs, imaging and vitals reviewed.  Lipase mildly elevated to 57.  CMP with hypokalemia to 3.2.  Normal kidney function.  CBC without leukocytosis, however does have elevated platelets to 431.  UA with proteinuria and evidence of UTI.  Pregnancy test negative.  Patient does not meet the SIRS or Sepsis criteria.  CT scan of abdomen pelvis revealed duplicated right ureteral collecting system with chronic severe hydronephrosis in the upper moiety.  Also with dilated right ureter mild right ureter O feel ill enhancement concerning for superimposed UTI/pyelitis.  Discussed case with urology who recommended starting patient on Bactrim and having her follow-up in the office in the morning.  Prophylactically treated patient with ceftriaxone and azithromycin.  Wet prep with white blood cells and clue cells however doubt bacterial vaginosis patient denies any abnormal discharge and denies any foul vaginal odor.   Patient discharged home with Bactrim and given strict recommendations to call urology office in the morning to make an appointment for follow-up.  I have also discussed reasons to return  immediately to the ER.  Patient expresses understanding and agrees with plan.  ED Discharge Orders        Ordered    sulfamethoxazole-trimethoprim (BACTRIM DS,SEPTRA DS) 800-160 MG tablet  2 times daily     09/20/17 0045       Braeden Dolinski S, PA-C 09/20/17 0221    Lorre NickAllen, Anthony, MD 09/24/17 [phone]

## 2017-09-19 NOTE — ED Triage Notes (Addendum)
Patient c/o intermittent RLQ pain x 3 years. Patient also c/o N/V and constipation. Patient states she has "to force" herself to urinate in order to get some relief from the RLQ pain

## 2017-09-19 NOTE — ED Notes (Signed)
Bed: WA04 Expected date:  Expected time:  Means of arrival:  Comments: 

## 2017-09-20 LAB — RPR: RPR Ser Ql: NONREACTIVE

## 2017-09-20 MED ORDER — AZITHROMYCIN 1 G PO PACK
1.0000 g | PACK | Freq: Once | ORAL | Status: AC
  Administered 2017-09-20: 1 g via ORAL
  Filled 2017-09-20: qty 1

## 2017-09-20 MED ORDER — CEFTRIAXONE SODIUM 250 MG IJ SOLR
250.0000 mg | Freq: Once | INTRAMUSCULAR | Status: AC
  Administered 2017-09-20: 250 mg via INTRAMUSCULAR
  Filled 2017-09-20: qty 250

## 2017-09-20 MED ORDER — STERILE WATER FOR INJECTION IJ SOLN
INTRAMUSCULAR | Status: AC
  Administered 2017-09-20: 2.1 mL
  Filled 2017-09-20: qty 10

## 2017-09-20 MED ORDER — SULFAMETHOXAZOLE-TRIMETHOPRIM 800-160 MG PO TABS: 1.0000 | ORAL_TABLET | Freq: Two times a day (BID) | ORAL | 0 refills | Status: AC

## 2017-09-20 NOTE — Discharge Instructions (Addendum)
You were given a prescription for antibiotics. Please take the antibiotic prescription fully.   I have prescribed a new medication for you today. It is important that when you pick the prescription up you discuss the potential interactions of this medication with other medications you are taking, including over the counter medications, with the pharmacists.   This new medication has potential side effects. Be sure to contact your primary care provider or return to the emergency department if you are experiencing new symptoms that you are unable to tolerate after starting the medication. You need to receive medical evaluation immediately if you start to experience blistering of the skin, rash, swelling, or difficulty breathing as these signs could indicate a more serious medication side effect.   You were given a referral to the urologist.  You need to call the office tomorrow morning to make an appointment to be seen so that you can follow-up on the results of your CT scan.  Please return to the ER sooner if you have any new or worsening symptoms, or if you have any of the following symptoms:  Abdominal pain that does not go away.  You have a fever.  You keep throwing up (vomiting).  The pain is felt only in portions of the abdomen. Pain in the right side could possibly be appendicitis. In an adult, pain in the left lower portion of the abdomen could be colitis or diverticulitis.  You pass bloody or black tarry stools.  There is bright red blood in the stool.  The constipation stays for more than 4 days.  There is belly (abdominal) or rectal pain.  You do not seem to be getting better.  You have any questions or concerns.

## 2017-09-21 LAB — URINE CULTURE

## 2017-09-22 ENCOUNTER — Telehealth: Payer: Self-pay

## 2017-09-22 NOTE — Telephone Encounter (Signed)
Post ED Visit - Positive Culture Follow-up  Culture report reviewed by antimicrobial stewardship pharmacist:  []  Alisha Wall, Pharm.D. []  Alisha Wall, [address]Pharm.D., BCPS AQ-ID []  Alisha Wall, Pharm.D., BCPS [x]  Alisha Wall, Pharm.D., BCPS []  Alisha Wall, [place] BCPS, AAHIVP []  Alisha Wall, Pharm.D., BCPS, AAHIVP []  Alisha Wall, PharmD, BCPS []  Alisha Wall, PharmD []  Alisha Wall, PharmD, BCPS  Positive urine culture Treated with Bactrim DS, organism sensitive to the same and no further patient follow-up is required at this time.  Alisha Wall, Alisha Wall 09/22/2017, 11:33 AM

## 2017-10-01 ENCOUNTER — Ambulatory Visit: Payer: Self-pay | Admitting: Obstetrics

## 2017-10-11 ENCOUNTER — Ambulatory Visit: Payer: Self-pay | Admitting: Obstetrics

## 2017-10-26 ENCOUNTER — Inpatient Hospital Stay (HOSPITAL_COMMUNITY)
Admission: EM | Admit: 2017-10-26 | Discharge: 2017-10-28 | DRG: 315 | Disposition: A | Discharge status: Home / Self Care | Payer: Self-pay | Attending: Internal Medicine | Admitting: Internal Medicine

## 2017-10-26 ENCOUNTER — Encounter (HOSPITAL_COMMUNITY): Payer: Self-pay

## 2017-10-26 ENCOUNTER — Other Ambulatory Visit: Payer: Self-pay

## 2017-10-26 ENCOUNTER — Emergency Department (HOSPITAL_COMMUNITY): Payer: Self-pay

## 2017-10-26 DIAGNOSIS — I9589 Other hypotension: Principal | ICD-10-CM | POA: Diagnosis present

## 2017-10-26 DIAGNOSIS — R112 Nausea with vomiting, unspecified: Secondary | ICD-10-CM

## 2017-10-26 DIAGNOSIS — E876 Hypokalemia: Secondary | ICD-10-CM | POA: Diagnosis present

## 2017-10-26 DIAGNOSIS — E119 Type 2 diabetes mellitus without complications: Secondary | ICD-10-CM | POA: Diagnosis present

## 2017-10-26 DIAGNOSIS — D649 Anemia, unspecified: Secondary | ICD-10-CM

## 2017-10-26 DIAGNOSIS — A084 Viral intestinal infection, unspecified: Secondary | ICD-10-CM | POA: Diagnosis present

## 2017-10-26 DIAGNOSIS — K5900 Constipation, unspecified: Secondary | ICD-10-CM | POA: Diagnosis present

## 2017-10-26 DIAGNOSIS — R109 Unspecified abdominal pain: Secondary | ICD-10-CM

## 2017-10-26 DIAGNOSIS — E861 Hypovolemia: Secondary | ICD-10-CM | POA: Diagnosis present

## 2017-10-26 DIAGNOSIS — E2740 Unspecified adrenocortical insufficiency: Secondary | ICD-10-CM | POA: Diagnosis present

## 2017-10-26 DIAGNOSIS — D6489 Other specified anemias: Secondary | ICD-10-CM | POA: Diagnosis present

## 2017-10-26 DIAGNOSIS — Z87440 Personal history of urinary (tract) infections: Secondary | ICD-10-CM

## 2017-10-26 DIAGNOSIS — R51 Headache: Secondary | ICD-10-CM | POA: Diagnosis present

## 2017-10-26 DIAGNOSIS — R1084 Generalized abdominal pain: Secondary | ICD-10-CM

## 2017-10-26 DIAGNOSIS — N2882 Megaloureter: Secondary | ICD-10-CM | POA: Diagnosis present

## 2017-10-26 DIAGNOSIS — F419 Anxiety disorder, unspecified: Secondary | ICD-10-CM

## 2017-10-26 DIAGNOSIS — R195 Other fecal abnormalities: Secondary | ICD-10-CM

## 2017-10-26 DIAGNOSIS — F418 Other specified anxiety disorders: Secondary | ICD-10-CM | POA: Diagnosis present

## 2017-10-26 DIAGNOSIS — F329 Major depressive disorder, single episode, unspecified: Secondary | ICD-10-CM

## 2017-10-26 DIAGNOSIS — I959 Hypotension, unspecified: Secondary | ICD-10-CM | POA: Diagnosis present

## 2017-10-26 DIAGNOSIS — N1339 Other hydronephrosis: Secondary | ICD-10-CM | POA: Diagnosis present

## 2017-10-26 LAB — COMPREHENSIVE METABOLIC PANEL
ALBUMIN: 4 g/dL (ref 3.5–5.0)
ALT: 14 U/L (ref 14–54)
AST: 20 U/L (ref 15–41)
Alkaline Phosphatase: 54 U/L (ref 38–126)
Anion gap: 9 (ref 5–15)
BUN: 13 mg/dL (ref 6–20)
CHLORIDE: 108 mmol/L (ref 101–111)
CO2: 23 mmol/L (ref 22–32)
Calcium: 8.7 mg/dL — ABNORMAL LOW (ref 8.9–10.3)
Creatinine, Ser: 0.55 mg/dL (ref 0.44–1.00)
GFR calc Af Amer: >60 mL/min (ref >60)
GLUCOSE: 94 mg/dL (ref 65–99)
POTASSIUM: 3.7 mmol/L (ref 3.5–5.1)
Sodium: 140 mmol/L (ref 135–145)
Total Bilirubin: 0.5 mg/dL (ref 0.3–1.2)
Total Protein: 7.6 g/dL (ref 6.5–8.1)

## 2017-10-26 LAB — URINALYSIS, ROUTINE W REFLEX MICROSCOPIC
Bilirubin Urine: NEGATIVE
Glucose, UA: NEGATIVE mg/dL
Ketones, ur: NEGATIVE mg/dL
LEUKOCYTES UA: NEGATIVE
Nitrite: NEGATIVE
Protein, ur: NEGATIVE mg/dL
SPECIFIC GRAVITY, URINE: 1.015 (ref 1.005–1.030)
pH: 6 (ref 5.0–8.0)

## 2017-10-26 LAB — CBC
HEMATOCRIT: 35 % — AB (ref 36.0–46.0)
HEMOGLOBIN: 11.8 g/dL — AB (ref 12.0–15.0)
MCH: 28.6 pg (ref 26.0–34.0)
MCHC: 33.7 g/dL (ref 30.0–36.0)
MCV: 84.7 fL (ref 78.0–100.0)
Platelets: 338 10*3/uL (ref 150–400)
RBC: 4.13 MIL/uL (ref 3.87–5.11)
RDW: 13.4 % (ref 11.5–15.5)
WBC: 10 10*3/uL (ref 4.0–10.5)

## 2017-10-26 LAB — HEMOGLOBIN A1C
Hgb A1c MFr Bld: 5.4 % (ref 4.8–5.6)
MEAN PLASMA GLUCOSE: 108.28 mg/dL

## 2017-10-26 LAB — LIPASE, BLOOD: LIPASE: 35 U/L (ref 11–51)

## 2017-10-26 LAB — POC URINE PREG, ED: Preg Test, Ur: NEGATIVE

## 2017-10-26 LAB — POC OCCULT BLOOD, ED: FECAL OCCULT BLD: POSITIVE — AB

## 2017-10-26 LAB — T4, FREE: Free T4: 0.87 ng/dL (ref 0.82–1.77)

## 2017-10-26 LAB — TSH: TSH: 0.645 u[IU]/mL (ref 0.350–4.500)

## 2017-10-26 MED ORDER — ACETAMINOPHEN 650 MG RE SUPP: 650.0000 mg | Freq: Four times a day (QID) | RECTAL | Status: DC | PRN

## 2017-10-26 MED ORDER — ACETAMINOPHEN 325 MG PO TABS
650.0000 mg | ORAL_TABLET | Freq: Four times a day (QID) | ORAL | Status: DC | PRN
  Administered 2017-10-26: 650 mg via ORAL
  Filled 2017-10-26: qty 2

## 2017-10-26 MED ORDER — SODIUM CHLORIDE 0.9 % IV BOLUS
1000.0000 mL | Freq: Once | INTRAVENOUS | Status: AC
  Administered 2017-10-26: 1000 mL via INTRAVENOUS

## 2017-10-26 MED ORDER — OXYCODONE HCL 5 MG PO TABS
5.0000 mg | ORAL_TABLET | ORAL | Status: DC | PRN
  Administered 2017-10-26 – 2017-10-28 (×6): 5 mg via ORAL
  Filled 2017-10-26 (×6): qty 1

## 2017-10-26 MED ORDER — HEPARIN SODIUM (PORCINE) 5000 UNIT/ML IJ SOLN
5000.0000 [IU] | Freq: Three times a day (TID) | INTRAMUSCULAR | Status: DC
  Administered 2017-10-26 – 2017-10-28 (×6): 5000 [IU] via SUBCUTANEOUS
  Filled 2017-10-26 (×6): qty 1

## 2017-10-26 MED ORDER — FENTANYL CITRATE (PF) 100 MCG/2ML IJ SOLN
25.0000 ug | Freq: Once | INTRAMUSCULAR | Status: AC
  Administered 2017-10-26: 25 ug via INTRAVENOUS
  Filled 2017-10-26: qty 2

## 2017-10-26 MED ORDER — ONDANSETRON 4 MG PO TBDP
4.0000 mg | ORAL_TABLET | Freq: Once | ORAL | Status: AC
  Administered 2017-10-26: 4 mg via ORAL
  Filled 2017-10-26: qty 1

## 2017-10-26 MED ORDER — SODIUM CHLORIDE 0.9 % IV BOLUS
500.0000 mL | Freq: Once | INTRAVENOUS | Status: AC
  Administered 2017-10-26: 500 mL via INTRAVENOUS

## 2017-10-26 MED ORDER — DICYCLOMINE HCL 10 MG PO CAPS
20.0000 mg | ORAL_CAPSULE | Freq: Once | ORAL | Status: AC
  Administered 2017-10-26: 20 mg via ORAL
  Filled 2017-10-26: qty 2

## 2017-10-26 MED ORDER — IOPAMIDOL (ISOVUE-300) INJECTION 61%
INTRAVENOUS | Status: AC
  Filled 2017-10-26: qty 100

## 2017-10-26 MED ORDER — IOPAMIDOL (ISOVUE-300) INJECTION 61%
100.0000 mL | Freq: Once | INTRAVENOUS | Status: AC | PRN
  Administered 2017-10-26: 100 mL via INTRAVENOUS

## 2017-10-26 MED ORDER — SODIUM CHLORIDE 0.9 % IV SOLN
INTRAVENOUS | Status: DC
  Administered 2017-10-26 – 2017-10-28 (×5): via INTRAVENOUS

## 2017-10-26 NOTE — ED Notes (Signed)
Patient eating and drinking without nausea, emesis or pain

## 2017-10-26 NOTE — ED Notes (Signed)
ED TO INPATIENT HANDOFF REPORT  Name/Age/Gender Alisha Wall 27 y.o. female  Code Status Code Status History    Date Active Date Inactive Code Status Order ID Comments User Context   11/27/2015 0136 11/28/2015 1910 Full Code [id number]  Shelly Bombard, MD Inpatient   11/26/2015 1437 11/27/2015 0136 Full Code [id number]  Jonnie Finner, RN Inpatient      Home/SNF/Other Home  Chief Complaint abdominal pain  Level of Care/Admitting Diagnosis ED Disposition    ED Disposition Condition [facility] Area: [facility] [[id number]]  Level of Care: Telemetry [5]  Admit to tele based on following criteria: Other see comments  Comments: Hypotensive  Diagnosis: Hypotension [650354]  Admitting Physician: Raiford Noble LATIF [[id number]]  Attending Physician: Raiford Noble LATIF [[id number]]  PT Class (Do Not Modify): Observation [104]  PT Acc Code (Do Not Modify): Observation [10022]       Medical History Past Medical History:  Diagnosis Date  . Anxiety   . Depression   . UTI (lower urinary tract infection)     Allergies No Known Allergies  IV Location/Drains/Wounds Patient Lines/Drains/Airways Status   Active Line/Drains/Airways    Name:   Placement date:   Placement time:   Site:   Days:   Peripheral IV 10/26/17 Left Antecubital   10/26/17    0820    Antecubital   less than 1   Epidural Catheter 11/26/15   11/26/15    1528     700          Labs/Imaging Results for orders placed or performed during the hospital encounter of 10/26/17 (from the past 48 hour(s))  POC urine preg, ED (not at [facility])     Status: None   Collection Time: 10/26/17  7:45 AM  Result Value Ref Range   Preg Test, Ur NEGATIVE NEGATIVE    Comment:        THE SENSITIVITY OF THIS METHODOLOGY IS >24 mIU/mL   Urinalysis, Routine w reflex microscopic     Status: Abnormal   Collection Time: 10/26/17  7:57 AM  Result Value Ref Range   Color, Urine YELLOW YELLOW    APPearance CLEAR CLEAR   Specific Gravity, Urine 1.015 1.005 - 1.030   pH 6.0 5.0 - 8.0   Glucose, UA NEGATIVE NEGATIVE mg/dL   Hgb urine dipstick LARGE (A) NEGATIVE   Bilirubin Urine NEGATIVE NEGATIVE   Ketones, ur NEGATIVE NEGATIVE mg/dL   Protein, ur NEGATIVE NEGATIVE mg/dL   Nitrite NEGATIVE NEGATIVE   Leukocytes, UA NEGATIVE NEGATIVE   RBC / HPF 6-10 0 - 5 RBC/hpf   WBC, UA 0-5 0 - 5 WBC/hpf   Bacteria, UA RARE (A) NONE SEEN   Squamous Epithelial / LPF 0-5 0 - 5   Mucus PRESENT     Comment: Performed at [facility], [place] [address], [place], [place] [address]  Lipase, blood     Status: None   Collection Time: 10/26/17  8:19 AM  Result Value Ref Range   Lipase 35 11 - 51 U/L    Comment: Performed at [facility], [place] [address], [place], [place] [address]  Comprehensive metabolic panel     Status: Abnormal   Collection Time: 10/26/17  8:19 AM  Result Value Ref Range   Sodium 140 135 - 145 mmol/L   Potassium 3.7 3.5 - 5.1 mmol/L   Chloride 108 101 - 111 mmol/L   CO2 23 22 - 32  mmol/L   Glucose, Bld 94 65 - 99 mg/dL   BUN 13 6 - 20 mg/dL   Creatinine, Ser 0.55 0.44 - 1.00 mg/dL   Calcium 8.7 (L) 8.9 - 10.3 mg/dL   Total Protein 7.6 6.5 - 8.1 g/dL   Albumin 4.0 3.5 - 5.0 g/dL   AST 20 15 - 41 U/L   ALT 14 14 - 54 U/L   Alkaline Phosphatase 54 38 - 126 U/L   Total Bilirubin 0.5 0.3 - 1.2 mg/dL   GFR calc non Af Amer >60 >60 mL/min   GFR calc Af Amer >60 >60 mL/min    Comment: (NOTE) The eGFR has been calculated using the CKD EPI equation. This calculation has not been validated in all clinical situations. eGFR's persistently <60 mL/min signify possible Chronic Kidney Disease.    Anion gap 9 5 - 15    Comment: Performed at [facility], [place] [address], [place], [place] [address]  CBC     Status: Abnormal   Collection Time: 10/26/17  8:19 AM  Result Value Ref Range   WBC 10.0 4.0 - 10.5 K/uL   RBC 4.13 3.87 -  5.11 MIL/uL   Hemoglobin 11.8 (L) 12.0 - 15.0 g/dL   HCT 35.0 (L) 36.0 - 46.0 %   MCV 84.7 78.0 - 100.0 fL   MCH 28.6 26.0 - 34.0 pg   MCHC 33.7 30.0 - 36.0 g/dL   RDW 13.4 11.5 - 15.5 %   Platelets 338 150 - 400 K/uL    Comment: Performed at [facility], [place] [address], [place], [place] [address]  POC occult blood, ED Provider will collect     Status: Abnormal   Collection Time: 10/26/17  2:20 PM  Result Value Ref Range   Fecal Occult Bld POSITIVE (A) NEGATIVE   Ct Abdomen Pelvis W Contrast  Result Date: 10/26/2017 CLINICAL DATA:  27 year old female with a history of abdominal pain EXAM: CT ABDOMEN AND PELVIS WITH CONTRAST TECHNIQUE: Multidetector CT imaging of the abdomen and pelvis was performed using the standard protocol following bolus administration of intravenous contrast. CONTRAST:  184m ISOVUE-300 IOPAMIDOL (ISOVUE-300) INJECTION 61% COMPARISON:  09/19/2017, CT 02/15/2014 FINDINGS: Lower chest: No acute abnormality. Hepatobiliary: Unremarkable appearance of liver. Unremarkable gallbladder. Pancreas: Unremarkable pancreas Spleen: Unremarkable spleen Adrenals/Urinary Tract: Unremarkable appearance of the adrenal glands. 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 Prior ureterocele is not as well evident on the current CT. No inflammatory changes or stones. The lower pole moiety of the right collecting system without hydronephrosis or inflammatory changes. No stones. The lower pole moiety ureter is not well visualized. Redemonstration of partial duplication of the left collecting system with the ureters joining in the abdomen. No nephrolithiasis or hydronephrosis. No perinephric stranding. Stomach/Bowel: Unremarkable appearance of stomach. Unremarkable small bowel. No abnormal distention. Unremarkable colon with  no inflammatory changes. Appendix is not visualized, however, no inflammatory changes are present adjacent to the cecum to indicate an appendicitis. Vascular/Lymphatic: No atherosclerotic changes. Reproductive: Physiologic fluid within the endometrial canal. Otherwise unremarkable uterus/adnexa. Other: No lymphadenopathy or inflammatory changes within the abdomen. Small fat containing umbilical hernia. Musculoskeletal: Negative for acute displaced fracture. IMPRESSION: Negative for acute abnormality accounting for abdominal pain. 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 No acute inflammatory changes. 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 Electronically Signed   By: Corrie Mckusick D.O.   On: 10/26/2017 11:08    Pending Labs Unresulted Labs (From admission, onward)   Start     Ordered   10/26/17 1632  TSH  STAT,   STAT     10/26/17 1631   Signed and Held  CBC  (heparin)  Once,   R    Comments:  Baseline for heparin therapy IF NOT ALREADY DRAWN.  Notify MD if PLT < 100 K.    Signed and Held   Signed and Held  Creatinine, serum  (heparin)  Once,   R    Comments:  Baseline for heparin therapy IF NOT ALREADY DRAWN.    Signed and Held   Signed and Held  Hemoglobin A1c  Once,   R     Signed and Held   Signed and Held  Urinalysis, Complete w Microscopic  Once,   R     Signed and Held   Signed and Held  Comprehensive metabolic panel  Tomorrow morning,   R     Signed and Held   Signed and Held  CBC  Tomorrow morning,   R     Signed and Held   Signed and Held  Cortisol-am, blood  Tomorrow morning,   R     Signed and Held   Signed and Held  T4, free  Add-on,   R     Signed and Held      Vitals/Pain Today's Vitals   10/26/17 1318 10/26/17 1337 10/26/17 1400 10/26/17 1604  BP: 103/63 (!) 79/61 (!) 89/76 (!) 89/59  Pulse: (!) 53 (!) 52 81 (!) 52  Resp:    16  Temp:       TempSrc:      SpO2: 100% 99% 99% 98%  PainSc:        Isolation Precautions No active isolations  Medications Medications  iopamidol (ISOVUE-300) 61 % injection (has no administration in time range)  ondansetron (ZOFRAN-ODT) disintegrating tablet 4 mg (4 mg Oral Given 10/26/17 0704)  fentaNYL (SUBLIMAZE) injection 25 mcg (25 mcg Intravenous Given 10/26/17 0858)  sodium chloride 0.9 % bolus 500 mL (0 mLs Intravenous Stopped 10/26/17 1008)  iopamidol (ISOVUE-300) 61 % injection 100 mL (100 mLs Intravenous Contrast Given 10/26/17 0925)  sodium chloride 0.9 % bolus 1,000 mL (0 mLs Intravenous Stopped 10/26/17 1422)  dicyclomine (BENTYL) capsule 20 mg (20 mg Oral Given 10/26/17 1142)  sodium chloride 0.9 % bolus 1,000 mL (0 mLs Intravenous Stopped 10/26/17 1610)    Mobility walks

## 2017-10-26 NOTE — ED Triage Notes (Signed)
Pt complains of vomiting since 2am, she ate [place] food about 9pm Pt denies diarrhea
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
## 2017-10-26 NOTE — ED Provider Notes (Signed)
[facility]-EMERGENCY DEPT Provider Note   CSN: [id number] Arrival date & time: 10/26/17  0432     History   Chief Complaint Chief Complaint  Patient presents with  . Abdominal Pain    HPI Alisha Wall is a 27 y.o. female with a history of anxiety, depression, chronic abdominal pain, and duplicated right ureteral collecting system who presents the emergency department with complaints of acute on chronic abdominal pain.  Patient states that she has had intermittent abdominal pain for the past several years.  She states this current episode started around 1:00 this a.m.  States this woke her from sleep.  Pain seems to be most prominent in the epigastric region at present, however she states it is fairly generalized.  Describes it as a stabbing pain.  Rates it a 10 out of 10 in severity, somewhat improved with laying flat, worsened when she is up and doing things.  She had associated nausea with 4 episodes of nonbloody emesis.  States this is similar to previous abdominal pain.  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  Urology was consulted, it was recommended she be started on Bactrim with follow-up the subsequent morning after discharge from the ER.  Patient states that she did take the Bactrim.  She states that she went to the urology office, however left prior to being seen and has not followed up since.  This is not the first time she has had recurrence of pain since her visit in early April.  She is currently menstruating.  Denies fever, diarrhea, constipation, blood in stool, dysuria, urinary frequency, urinary urgency, or vaginal discharge.  Patient is sexually active in a  monogamous relationship with her fianc.  No concern for STD.  HPI  Past Medical History:  Diagnosis Date  . Anxiety   . Depression   . UTI (lower urinary tract infection)     Patient Active Problem List   Diagnosis Date Noted  . NSVD (normal spontaneous vaginal delivery) 11/27/2015  . Active labor at term 11/26/2015    Past Surgical History:  Procedure Laterality Date  . INDUCED ABORTION       OB History    Gravida  5   Para  3   Term  3   Preterm      AB  2   Living  3     SAB  1   TAB  1   Ectopic      Multiple  0   Live Births  3            Home Medications    Prior to Admission medications   Medication Sig Start Date End Date Taking? Authorizing Provider  ibuprofen (ADVIL,MOTRIN) 600 MG tablet Take 1 tablet (600 mg total) by mouth every 6 (six) hours as needed for mild pain or moderate pain. 12/06/16   Fayrene Helper, PA-C  Prenat-FeAsp-Meth-FA-DHA w/o A (PRENATE PIXIE) 10-0.6-0.4-200 MG CAPS Take 1 capsule by mouth daily before breakfast. 05/30/17   Brock Bad, MD    Family History Family History  Problem Relation Age of Onset  . Alcohol abuse Neg Hx   . Arthritis Neg Hx   . Asthma Neg Hx   . Cancer Neg Hx   .  Birth defects Neg Hx   . COPD Neg Hx   . Depression Neg Hx   . Diabetes Neg Hx   . Drug abuse Neg Hx   . Early death Neg Hx   . Hearing loss Neg Hx   . Heart disease Neg Hx   . Hyperlipidemia Neg Hx   . Hypertension Neg Hx   . Kidney disease Neg Hx   . Learning disabilities Neg Hx   . Mental illness Neg Hx   . Mental retardation Neg Hx   . Miscarriages / Stillbirths Neg Hx   . Stroke Neg Hx   . Vision loss Neg Hx   . Varicose Veins Neg Hx     Social History Social History   Tobacco Use  . Smoking status: Never Smoker  . Smokeless tobacco: Never Used  Substance Use Topics  . Alcohol use: No    Comment: occ wine (rare)   . Drug use: No     Allergies   Patient has no known allergies.   Review of  Systems Review of Systems  Constitutional: Negative for fever.  Respiratory: Negative for shortness of breath.   Cardiovascular: Negative for chest pain.  Gastrointestinal: Positive for abdominal pain, nausea and vomiting. Negative for blood in stool, constipation and diarrhea.  Genitourinary: Positive for vaginal bleeding (currently menstruating). Negative for dysuria, frequency, urgency and vaginal discharge.  All other systems reviewed and are negative.    Physical Exam Updated Vital Signs BP 97/62   Pulse 77   Temp 97.9 F (36.6 C) (Oral)   Resp 15   LMP 10/26/2017   SpO2 100%   Physical Exam  Constitutional: She appears well-developed and well-nourished. No distress.  HENT:  Head: Normocephalic and atraumatic.  Eyes: Conjunctivae are normal. Right eye exhibits no discharge. Left eye exhibits no discharge.  Cardiovascular: Normal rate and regular rhythm.  No murmur heard. Pulmonary/Chest: Breath sounds normal. No respiratory distress. She has no wheezes. She has no rales.  Abdominal: Soft. She exhibits no distension. There is generalized tenderness (Nonfocal). There is guarding (voluntary). There is no rigidity, no rebound and no CVA tenderness.  Genitourinary: Rectal exam shows guaiac positive stool. Rectal exam shows no external hemorrhoid, no fissure, no mass and no tenderness. Pelvic exam was performed with patient in the knee-chest position.  Genitourinary Comments: Soft brown stool. No melena. No bright red blood. RN Meghan present as chaperone.   Neurological: She is alert.  Clear speech.   Skin: Skin is warm and dry. No rash noted.  Psychiatric: She has a normal mood and affect. Her behavior is normal.  Nursing note and vitals reviewed.   ED Treatments / Results  Labs Results for orders placed or performed during the hospital encounter of 10/26/17  Lipase, blood  Result Value Ref Range   Lipase 35 11 - 51 U/L  Comprehensive metabolic panel  Result Value Ref  Range   Sodium 140 135 - 145 mmol/L   Potassium 3.7 3.5 - 5.1 mmol/L   Chloride 108 101 - 111 mmol/L   CO2 23 22 - 32 mmol/L   Glucose, Bld 94 65 - 99 mg/dL   BUN 13 6 - 20 mg/dL   Creatinine, Ser 9.60 0.44 - 1.00 mg/dL   Calcium 8.7 (L) 8.9 - 10.3 mg/dL   Total Protein 7.6 6.5 - 8.1 g/dL   Albumin 4.0 3.5 - 5.0 g/dL   AST 20 15 - 41 U/L   ALT 14 14 - 54 U/L  Alkaline Phosphatase 54 38 - 126 U/L   Total Bilirubin 0.5 0.3 - 1.2 mg/dL   GFR calc non Af Amer >60 >60 mL/min   GFR calc Af Amer >60 >60 mL/min   Anion gap 9 5 - 15  CBC  Result Value Ref Range   WBC 10.0 4.0 - 10.5 K/uL   RBC 4.13 3.87 - 5.11 MIL/uL   Hemoglobin 11.8 (L) 12.0 - 15.0 g/dL   HCT 09.8 (L) 11.9 - 14.7 %   MCV 84.7 78.0 - 100.0 fL   MCH 28.6 26.0 - 34.0 pg   MCHC 33.7 30.0 - 36.0 g/dL   RDW 82.9 56.2 - 13.0 %   Platelets 338 150 - 400 K/uL  Urinalysis, Routine w reflex microscopic  Result Value Ref Range   Color, Urine YELLOW YELLOW   APPearance CLEAR CLEAR   Specific Gravity, Urine 1.015 1.005 - 1.030   pH 6.0 5.0 - 8.0   Glucose, UA NEGATIVE NEGATIVE mg/dL   Hgb urine dipstick LARGE (A) NEGATIVE   Bilirubin Urine NEGATIVE NEGATIVE   Ketones, ur NEGATIVE NEGATIVE mg/dL   Protein, ur NEGATIVE NEGATIVE mg/dL   Nitrite NEGATIVE NEGATIVE   Leukocytes, UA NEGATIVE NEGATIVE   RBC / HPF 6-10 0 - 5 RBC/hpf   WBC, UA 0-5 0 - 5 WBC/hpf   Bacteria, UA RARE (A) NONE SEEN   Squamous Epithelial / LPF 0-5 0 - 5   Mucus PRESENT   POC urine preg, ED (not at [facility])  Result Value Ref Range   Preg Test, Ur NEGATIVE NEGATIVE  POC occult blood, ED Provider will collect  Result Value Ref Range   Fecal Occult Bld POSITIVE (A) NEGATIVE   EKG None  Radiology Ct Abdomen Pelvis W Contrast  Result Date: 10/26/2017 CLINICAL DATA:  27 year old female with a history of abdominal pain EXAM: CT ABDOMEN AND PELVIS WITH CONTRAST TECHNIQUE: Multidetector CT imaging of the abdomen and pelvis was performed using the standard  protocol following bolus administration of intravenous contrast. CONTRAST:  ISOVUE-300 IOPAMIDOL (ISOVUE-300) INJECTION 61% COMPARISON:  09/19/2017, CT 02/15/2014 FINDINGS: Lower chest: No acute abnormality. Hepatobiliary: Unremarkable appearance of liver. Unremarkable gallbladder. Pancreas: Unremarkable pancreas Spleen: Unremarkable spleen Adrenals/Urinary Tract: Unremarkable appearance of the adrenal glands. 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 Prior ureterocele is not as well evident on the current CT. No inflammatory changes or stones. The lower pole moiety of the right collecting system without hydronephrosis or inflammatory changes. No stones. The lower pole moiety ureter is not well visualized. Redemonstration of partial duplication of the left collecting system with the ureters joining in the abdomen. No nephrolithiasis or hydronephrosis. No perinephric stranding. Stomach/Bowel: Unremarkable appearance of stomach. Unremarkable small bowel. No abnormal distention. Unremarkable colon with no inflammatory changes. Appendix is not visualized, however, no inflammatory changes are present adjacent to the cecum to indicate an appendicitis. Vascular/Lymphatic: No atherosclerotic changes. Reproductive: Physiologic fluid within the endometrial canal. Otherwise unremarkable uterus/adnexa. Other: No lymphadenopathy or inflammatory changes within the abdomen. Small fat containing umbilical hernia. Musculoskeletal: Negative for acute displaced fracture. IMPRESSION: Negative for acute abnormality accounting for abdominal pain. 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 No acute inflammatory changes. 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 Electronically Signed   By: Gilmer Mor D.O.  On: 10/26/2017 11:08     Previous Results Reviewed: CT abdomen pelvis with contrast 09/19/17:  IMPRESSION: 1. 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 2. Similar dilated RIGHT ureter, mild RIGHT urothelial enhancement concerning for superimposed urinary tract infection/pyelitis. Recommend urologic consultation.   Electronically Signed   By: Awilda Metro M.D.   On: 09/19/2017 21:58  Procedures Procedures (including critical care time)  Medications Ordered in ED Medications  ondansetron (ZOFRAN-ODT) disintegrating tablet 4 mg (4 mg Oral Given 10/26/17 0704)     Initial Impression / Assessment and Plan / ED Course  I have reviewed the triage vital signs and the nursing notes.  Pertinent labs & imaging results that were available during my care of the patient were reviewed by me and considered in my medical decision making (see chart for details).   Patient presents with abdominal pain and nausea and vomiting.  Patient is nontoxic-appearing, vitals within normal limits.  On exam patient has diffuse generalized abdominal tenderness which is non-focal, she has voluntary guarding throughout.  DDX includes pancreatitis, cholecystitis, gastritis, peptic ulcer disease, GERD, SBO, diverticulitis, pyelonephritis.  Discussed with supervising physician Dr. Charm Barges, will obtain abdominal labs and CT abdomen pelvis.  Will initiate treatment with low-dose of fentanyl given patient's blood pressure is borderline.   Labs reviewed no leukocytosis.  No significant electrolyte abnormalities.  Lipase, LFTs, and renal function WNL.  UA consistent with history of menstruation.  Urine pregnancy test is negative.  Of note patient's hemoglobin is 11.8 today, it was 13.1-month prior-we will obtain point-of-care occult blood. Rectal exam with soft brown stool-no bright red blood per rectum, no  melena, no anal fissures or visible hemorrhoids.    Fecal occult positive.  Patient denies hematochezia, melena, bloody emesis, increase in NSAIDs, alcohol, or recent trauma.  She has not had an EGD or colonoscopy previously.  BUN is WNL.   CT abdomen pelvis without acute abnormalities, renal collecting system duplications consistent with previous CT scan.  Patient's abdominal pain has significantly improved, she has not been vomiting throughout her ER stay, she is tolerating PO fluids.  Blood pressure consistently hypotensive.  She states that she generally just does not feel well.    Vitals:   10/26/17 1318 10/26/17 1337 10/26/17 1400 10/26/17 1604  BP: 103/63 (!) 79/61 (!) 89/76 (!) 89/59  Pulse: (!) 53 (!) 52 81 (!) 52  Resp:    16  Temp:      TempSrc:      SpO2: 100% 99% 99% 98%    Given patient remains hypotensive after adequate IV fluids we will plan for admission for observation. Discussed with supervising physician Dr. Charm Barges who is in agreement with plan.  16:40: CONSULT: Discussed case with hospitalist Dr. Marland Mcalpine who accepts admission.   Final Clinical Impressions(s) / ED Diagnoses   Final diagnoses:  Generalized abdominal pain  Hypotension, unspecified hypotension type    ED Discharge Orders    None       Cherly Anderson, PA-C 10/26/17 1648    Terrilee Files, MD 10/28/17 1402

## 2017-10-27 DIAGNOSIS — E876 Hypokalemia: Secondary | ICD-10-CM

## 2017-10-27 LAB — CBC
HCT: 32 % — ABNORMAL LOW (ref 36.0–46.0)
Hemoglobin: 10.6 g/dL — ABNORMAL LOW (ref 12.0–15.0)
MCH: 28.3 pg (ref 26.0–34.0)
MCHC: 33.1 g/dL (ref 30.0–36.0)
MCV: 85.6 fL (ref 78.0–100.0)
Platelets: 338 10*3/uL (ref 150–400)
RBC: 3.74 MIL/uL — AB (ref 3.87–5.11)
RDW: 13.5 % (ref 11.5–15.5)
WBC: 3.9 10*3/uL — AB (ref 4.0–10.5)

## 2017-10-27 LAB — COMPREHENSIVE METABOLIC PANEL
ALK PHOS: 51 U/L (ref 38–126)
ALT: 11 U/L — AB (ref 14–54)
AST: 18 U/L (ref 15–41)
Albumin: 3.4 g/dL — ABNORMAL LOW (ref 3.5–5.0)
Anion gap: 7 (ref 5–15)
BUN: 5 mg/dL — AB (ref 6–20)
CHLORIDE: 109 mmol/L (ref 101–111)
CO2: 24 mmol/L (ref 22–32)
CREATININE: 0.6 mg/dL (ref 0.44–1.00)
Calcium: 8 mg/dL — ABNORMAL LOW (ref 8.9–10.3)
GFR calc Af Amer: >60 mL/min (ref >60)
Glucose, Bld: 93 mg/dL (ref 65–99)
Potassium: 3.3 mmol/L — ABNORMAL LOW (ref 3.5–5.1)
Sodium: 140 mmol/L (ref 135–145)
Total Bilirubin: 0.7 mg/dL (ref 0.3–1.2)
Total Protein: 6.3 g/dL — ABNORMAL LOW (ref 6.5–8.1)

## 2017-10-27 LAB — MAGNESIUM: MAGNESIUM: 1.7 mg/dL (ref 1.7–2.4)

## 2017-10-27 LAB — PHOSPHORUS: PHOSPHORUS: 3.2 mg/dL (ref 2.5–4.6)

## 2017-10-27 LAB — CORTISOL-AM, BLOOD: Cortisol - AM: 0.4 ug/dL — ABNORMAL LOW (ref 6.7–22.6)

## 2017-10-27 MED ORDER — ONDANSETRON HCL 4 MG PO TABS: 4.0000 mg | ORAL_TABLET | Freq: Three times a day (TID) | ORAL | Status: DC | PRN

## 2017-10-27 MED ORDER — ONDANSETRON HCL 4 MG/2ML IJ SOLN
4.0000 mg | Freq: Four times a day (QID) | INTRAMUSCULAR | Status: DC | PRN
Start: 2017-10-27 — End: 2017-10-28
  Administered 2017-10-27: 4 mg via INTRAVENOUS
  Filled 2017-10-27: qty 2

## 2017-10-27 MED ORDER — COSYNTROPIN 0.25 MG IJ SOLR
0.2500 mg | Freq: Once | INTRAMUSCULAR | Status: AC
  Administered 2017-10-28: 0.25 mg via INTRAVENOUS
  Filled 2017-10-27: qty 0.25

## 2017-10-27 MED ORDER — POTASSIUM CHLORIDE CRYS ER 20 MEQ PO TBCR
40.0000 meq | EXTENDED_RELEASE_TABLET | Freq: Two times a day (BID) | ORAL | Status: AC
  Administered 2017-10-27 (×2): 40 meq via ORAL
  Filled 2017-10-27 (×2): qty 2

## 2017-10-27 MED ORDER — BUTALBITAL-APAP-CAFFEINE 50-325-40 MG PO TABS
2.0000 | ORAL_TABLET | Freq: Once | ORAL | Status: AC
  Administered 2017-10-27: 2 via ORAL
  Filled 2017-10-27: qty 2

## 2017-10-27 MED ORDER — KETOROLAC TROMETHAMINE 30 MG/ML IJ SOLN
30.0000 mg | Freq: Once | INTRAMUSCULAR | Status: AC
  Administered 2017-10-27: 30 mg via INTRAVENOUS
  Filled 2017-10-27: qty 1

## 2017-10-27 MED ORDER — ONDANSETRON HCL 4 MG/2ML IJ SOLN: 4.0000 mg | Freq: Four times a day (QID) | INTRAMUSCULAR | Status: DC | PRN

## 2017-10-27 MED ORDER — SODIUM CHLORIDE 0.9 % IV BOLUS: 500.0000 mL | Freq: Once | INTRAVENOUS | Status: DC

## 2017-10-27 MED ORDER — ONDANSETRON HCL 4 MG PO TABS: 4.0000 mg | ORAL_TABLET | Freq: Four times a day (QID) | ORAL | Status: DC | PRN

## 2017-10-27 MED ORDER — SODIUM CHLORIDE 0.9 % IV BOLUS
1000.0000 mL | Freq: Once | INTRAVENOUS | Status: AC
  Administered 2017-10-27: 1000 mL via INTRAVENOUS

## 2017-10-27 MED ORDER — POTASSIUM CHLORIDE CRYS ER 20 MEQ PO TBCR: 40.0000 meq | EXTENDED_RELEASE_TABLET | Freq: Two times a day (BID) | ORAL | Status: DC
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
## 2017-10-28 DIAGNOSIS — D649 Anemia, unspecified: Secondary | ICD-10-CM

## 2017-10-28 DIAGNOSIS — E876 Hypokalemia: Secondary | ICD-10-CM

## 2017-10-28 DIAGNOSIS — E2740 Unspecified adrenocortical insufficiency: Secondary | ICD-10-CM

## 2017-10-28 DIAGNOSIS — F419 Anxiety disorder, unspecified: Secondary | ICD-10-CM

## 2017-10-28 DIAGNOSIS — R195 Other fecal abnormalities: Secondary | ICD-10-CM

## 2017-10-28 DIAGNOSIS — I959 Hypotension, unspecified: Secondary | ICD-10-CM

## 2017-10-28 DIAGNOSIS — A084 Viral intestinal infection, unspecified: Secondary | ICD-10-CM

## 2017-10-28 DIAGNOSIS — R1084 Generalized abdominal pain: Secondary | ICD-10-CM

## 2017-10-28 DIAGNOSIS — F329 Major depressive disorder, single episode, unspecified: Secondary | ICD-10-CM

## 2017-10-28 DIAGNOSIS — R112 Nausea with vomiting, unspecified: Secondary | ICD-10-CM

## 2017-10-28 LAB — COMPREHENSIVE METABOLIC PANEL
ALK PHOS: 51 U/L (ref 38–126)
ALT: 13 U/L — AB (ref 14–54)
AST: 18 U/L (ref 15–41)
Albumin: 3.6 g/dL (ref 3.5–5.0)
Anion gap: 9 (ref 5–15)
CO2: 25 mmol/L (ref 22–32)
CREATININE: 0.58 mg/dL (ref 0.44–1.00)
Calcium: 8.9 mg/dL (ref 8.9–10.3)
Chloride: 108 mmol/L (ref 101–111)
GFR calc Af Amer: >60 mL/min (ref >60)
Glucose, Bld: 89 mg/dL (ref 65–99)
Potassium: 3.8 mmol/L (ref 3.5–5.1)
Sodium: 142 mmol/L (ref 135–145)
Total Bilirubin: 1 mg/dL (ref 0.3–1.2)
Total Protein: 6.6 g/dL (ref 6.5–8.1)

## 2017-10-28 LAB — CBC WITH DIFFERENTIAL/PLATELET
BASOS ABS: 0 10*3/uL (ref 0.0–0.1)
Basophils Relative: 0 %
Eosinophils Absolute: 0.2 10*3/uL (ref 0.0–0.7)
Eosinophils Relative: 4 %
HCT: 33 % — ABNORMAL LOW (ref 36.0–46.0)
Hemoglobin: 10.9 g/dL — ABNORMAL LOW (ref 12.0–15.0)
LYMPHS PCT: 55 %
Lymphs Abs: 2 10*3/uL (ref 0.7–4.0)
MCH: 28.3 pg (ref 26.0–34.0)
MCHC: 33 g/dL (ref 30.0–36.0)
MCV: 85.7 fL (ref 78.0–100.0)
Monocytes Absolute: 0.2 10*3/uL (ref 0.1–1.0)
Monocytes Relative: 6 %
NEUTROS ABS: 1.3 10*3/uL — AB (ref 1.7–7.7)
Neutrophils Relative %: 35 %
Platelets: 347 10*3/uL (ref 150–400)
RBC: 3.85 MIL/uL — AB (ref 3.87–5.11)
RDW: 13.4 % (ref 11.5–15.5)
WBC: 3.7 10*3/uL — ABNORMAL LOW (ref 4.0–10.5)

## 2017-10-28 LAB — ACTH STIMULATION, 3 TIME POINTS
CORTISOL 60 MIN: 17.7 ug/dL
Cortisol, 30 Min: 16.1 ug/dL
Cortisol, Base: 12.7 ug/dL

## 2017-10-28 LAB — PHOSPHORUS: Phosphorus: 3.5 mg/dL (ref 2.5–4.6)

## 2017-10-28 LAB — MAGNESIUM: MAGNESIUM: 1.8 mg/dL (ref 1.7–2.4)

## 2017-10-28 MED ORDER — HYDROCORTISONE 5 MG PO TABS
5.0000 mg | ORAL_TABLET | Freq: Two times a day (BID) | ORAL | Status: DC
  Administered 2017-10-28 (×2): 5 mg via ORAL
  Filled 2017-10-28 (×2): qty 1

## 2017-10-28 MED ORDER — POLYETHYLENE GLYCOL 3350 17 G PO PACK
17.0000 g | PACK | Freq: Two times a day (BID) | ORAL | Status: DC
  Administered 2017-10-28: 17 g via ORAL
  Filled 2017-10-28: qty 1

## 2017-10-28 MED ORDER — SENNOSIDES-DOCUSATE SODIUM 8.6-50 MG PO TABS: 1.0000 | ORAL_TABLET | Freq: Every day | ORAL | 0 refills | Status: DC

## 2017-10-28 MED ORDER — BUTALBITAL-APAP-CAFFEINE 50-325-40 MG PO TABS
2.0000 | ORAL_TABLET | Freq: Once | ORAL | Status: AC
  Administered 2017-10-28: 2 via ORAL
  Filled 2017-10-28: qty 2

## 2017-10-28 MED ORDER — HYDROCORTISONE 5 MG PO TABS: 5.0000 mg | ORAL_TABLET | Freq: Every day | ORAL | Status: DC

## 2017-10-28 MED ORDER — SENNOSIDES-DOCUSATE SODIUM 8.6-50 MG PO TABS
1.0000 | ORAL_TABLET | Freq: Two times a day (BID) | ORAL | Status: DC
  Administered 2017-10-28: 1 via ORAL
  Filled 2017-10-28: qty 1

## 2017-10-28 MED ORDER — HYDROCORTISONE 5 MG PO TABS: 5.0000 mg | ORAL_TABLET | Freq: Two times a day (BID) | ORAL | 0 refills | Status: DC

## 2017-10-28 MED ORDER — SODIUM CHLORIDE 0.9 % IV BOLUS
1000.0000 mL | Freq: Once | INTRAVENOUS | Status: AC
  Administered 2017-10-28: 1000 mL via INTRAVENOUS

## 2017-10-28 MED ORDER — DEXAMETHASONE SODIUM PHOSPHATE 4 MG/ML IJ SOLN
4.0000 mg | Freq: Once | INTRAMUSCULAR | Status: AC
  Administered 2017-10-28: 4 mg via INTRAVENOUS
  Filled 2017-10-28: qty 1
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
## 2017-10-28 NOTE — Care Management Note (Addendum)
Case Management Note  Patient Details  Name: Alisha Wall MRN: [id number] Date of Birth: June 22, 1990  Subjective/Objective:    Pt presents for hypotension with multiple comorbidities.  Pt without PCP or insurance.          Action/Plan: Information for [facility] on [facility].  Attempted to d/w patient but unable to get through on patient's phone.  Bedside RN will go over with patient.   Expected Discharge Date:  10/28/17               Expected Discharge Plan:  Home/Self Care  In-House Referral:  PCP / [facility]  Discharge planning Services  CM Consult  Post Acute Care Choice:    Choice offered to:     DME Arranged:    DME Agency:     HH Arranged:    HH Agency:     Status of Service:  Completed, signed off  If discussed at [employer], dates discussed:    Additional Comments:  Deveron Furlong, RN 10/28/2017, 4:29 PM

## 2017-10-29 LAB — ACTH: C206 ACTH: 13.5 pg/mL (ref 7.2–63.3)
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
## 2018-05-29 ENCOUNTER — Ambulatory Visit: Payer: Self-pay | Admitting: Obstetrics

## 2018-10-07 ENCOUNTER — Other Ambulatory Visit: Payer: Self-pay

## 2018-10-07 ENCOUNTER — Ambulatory Visit (INDEPENDENT_AMBULATORY_CARE_PROVIDER_SITE_OTHER): Payer: Self-pay | Admitting: Obstetrics

## 2018-10-07 ENCOUNTER — Encounter: Payer: Self-pay | Admitting: Obstetrics

## 2018-10-07 DIAGNOSIS — N3000 Acute cystitis without hematuria: Secondary | ICD-10-CM

## 2018-10-07 DIAGNOSIS — L723 Sebaceous cyst: Secondary | ICD-10-CM

## 2018-10-07 DIAGNOSIS — Z3009 Encounter for other general counseling and advice on contraception: Secondary | ICD-10-CM

## 2018-10-07 MED ORDER — NITROFURANTOIN MONOHYD MACRO 100 MG PO CAPS: 100.0000 mg | ORAL_CAPSULE | Freq: Two times a day (BID) | ORAL | 2 refills | Status: DC

## 2018-10-07 NOTE — Progress Notes (Signed)
   TELEHEALTH VIRTUAL GYNECOLOGY VISIT ENCOUNTER NOTE  I connected with Alisha Wall on 10/07/18 at 10:30 AM EDT by telephone at home and verified that I am speaking with the correct person using two identifiers.   I discussed the limitations, risks, security and privacy concerns of performing an evaluation and management service by telephone and the availability of in person appointments. I also discussed with the patient that there may be a patient responsible charge related to this service. The patient expressed understanding and agreed to proceed.   History:  Alisha Wall is a 28 y.o. [phone] female being evaluated today for a raised area of tissue im her left arm pit.  She says that the area is soft and non tender.  There is no drainage.  She also thinks that she may have a UTI.  Requests also like to discuss contraceptive choices. She denies any abnormal vaginal discharge, bleeding, pelvic pain or other concerns.       Past Medical History:  Diagnosis Date  . Anxiety   . Depression   . UTI (lower urinary tract infection)    Past Surgical History:  Procedure Laterality Date  . INDUCED ABORTION     The following portions of the patient's history were reviewed and updated as appropriate: allergies, current medications, past family history, past medical history, past social history, past surgical history and problem list.   Health Maintenance:  Normal pap and negative HRHPV on 09-26-2016.    Review of Systems:  Pertinent items noted in HPI and remainder of comprehensive ROS otherwise negative.  Physical Exam:   General:  Alert, oriented and cooperative.   Mental Status: Normal mood and affect perceived. Normal judgment and thought content.  Physical exam deferred due to nature of the encounter  Labs and Imaging No results found for this or any previous visit (from the past 336 hour(s)). No results found.    Assessment and Plan:     1. Sebaceous cyst of left axilla -  will follow clinically  2. Acute cystitis without hematuria Rx: - nitrofurantoin, macrocrystal-monohydrate, (MACROBID) 100 MG capsule; Take 1 capsule (100 mg total) by mouth 2 (two) times daily.  Dispense: 14 capsule; Refill: 2  3. Encounter for other general counseling and advice on contraception - wants Nexplanon      I discussed the assessment and treatment plan with the patient. The patient was provided an opportunity to ask questions and all were answered. The patient agreed with the plan and demonstrated an understanding of the instructions.   The patient was advised to call back or seek an in-person evaluation/go to the ED if the symptoms worsen or if the condition fails to improve as anticipated.  I provided 11 minutes of non-face-to-face time during this encounter.   Coral Ceo, MD [facility], [facility] 10-07-2018

## 2018-10-07 NOTE — Progress Notes (Signed)
Pt is on the phone for a virtual visit ([employer]) with dr Clearance Coots. Pt states she has chronic UTI symptoms.

## 2018-11-13 ENCOUNTER — Emergency Department (HOSPITAL_COMMUNITY)
Admission: EM | Admit: 2018-11-13 | Discharge: 2018-11-14 | Disposition: A | Discharge status: Home / Self Care | Payer: Medicaid Other | Attending: Emergency Medicine | Admitting: Emergency Medicine

## 2018-11-13 ENCOUNTER — Encounter (HOSPITAL_COMMUNITY): Payer: Self-pay

## 2018-11-13 ENCOUNTER — Other Ambulatory Visit: Payer: Self-pay

## 2018-11-13 DIAGNOSIS — N760 Acute vaginitis: Secondary | ICD-10-CM

## 2018-11-13 DIAGNOSIS — R102 Pelvic and perineal pain: Secondary | ICD-10-CM | POA: Diagnosis present

## 2018-11-13 DIAGNOSIS — B9689 Other specified bacterial agents as the cause of diseases classified elsewhere: Secondary | ICD-10-CM | POA: Diagnosis not present

## 2018-11-13 DIAGNOSIS — N3000 Acute cystitis without hematuria: Secondary | ICD-10-CM

## 2018-11-13 MED ORDER — HYDROCODONE-ACETAMINOPHEN 5-325 MG PO TABS
1.0000 | ORAL_TABLET | Freq: Once | ORAL | Status: AC
  Administered 2018-11-13: 1 via ORAL
  Filled 2018-11-13: qty 1

## 2018-11-13 NOTE — ED Provider Notes (Signed)
[facility]-EMERGENCY DEPT Provider Note   CSN: [id number] Arrival date & time: 11/13/18  2307    History   Chief Complaint Chief Complaint  Patient presents with  . Abdominal Pain  . Pelvic Pain    HPI Alisha Wall is a 28 y.o. female.     The history is provided by the patient.  Abdominal Pain  Pain location:  LLQ and RLQ Pain quality: aching   Pain severity:  Moderate Onset quality:  Gradual Timing:  Constant Progression:  Worsening Chronicity:  New Relieved by:  Nothing Worsened by:  Movement and palpation Associated symptoms: vaginal discharge   Associated symptoms: no chest pain, no constipation, no cough, no diarrhea, no dysuria, no fever, no vaginal bleeding and no vomiting   Pelvic Pain  Associated symptoms include abdominal pain. Pertinent negatives include no chest pain.   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  This started over 4 days ago.  She reports lower pelvic and back pain.  No fevers or vomiting.  She reports vaginal discharge.  She reports history of frequent UTIs and this may be similar to that. Past Medical History:  Diagnosis Date  . Anxiety   . Depression   . UTI (lower urinary tract infection)     Patient Active Problem List   Diagnosis Date Noted  . Hypokalemia 10/27/2017  . Hypotension 10/26/2017  . Nausea and vomiting 10/26/2017  . Viral gastroenteritis 10/26/2017  . Abdominal pain 10/26/2017  . Anxiety and depression 10/26/2017  . Normocytic anemia 10/26/2017  . Fecal occult blood test positive 10/26/2017  . NSVD (normal spontaneous vaginal delivery) 11/27/2015  . Active labor at term 11/26/2015    Past Surgical History:  Procedure Laterality Date  . INDUCED ABORTION       OB History    Gravida  5   Para  3   Term  3   Preterm      AB  2   Living  3     SAB  1   TAB  1   Ectopic      Multiple  0   Live Births  3            Home Medications    Prior to Admission medications   Medication Sig Start Date End Date Taking? Authorizing Provider  hydrocortisone (CORTEF) 5 MG tablet Take 1 tablet (5 mg total) by mouth 2 (two) times daily. Patient not taking: Reported on 10/07/2018 10/28/17   Marguerita MerlesSheikh, Omair Latif, DO  nitrofurantoin, macrocrystal-monohydrate, (MACROBID) 100 MG capsule Take 1 capsule (100 mg total) by mouth 2 (two) times daily. 10/07/18   Brock BadHarper, Charles A, MD  Prenat-FeAsp-Meth-FA-DHA w/o A (PRENATE PIXIE) 10-0.6-0.4-200 MG CAPS Take 1 capsule by mouth daily before breakfast. Patient not taking: Reported on 10/07/2018 05/30/17   Brock BadHarper, Charles A, MD    Family History Family History  Problem Relation Age of Onset  . Alcohol abuse Neg Hx   . Arthritis Neg Hx   . Asthma Neg Hx   . Cancer Neg Hx   . Birth defects Neg Hx   . COPD Neg Hx   . Depression Neg Hx   . Diabetes Neg Hx   . Drug abuse Neg Hx   . Early death Neg Hx   . Hearing loss Neg Hx   . Heart disease Neg Hx   . Hyperlipidemia Neg Hx   .  Hypertension Neg Hx   . Kidney disease Neg Hx   . Learning disabilities Neg Hx   . Mental illness Neg Hx   . Mental retardation Neg Hx   . Miscarriages / Stillbirths Neg Hx   . Stroke Neg Hx   . Vision loss Neg Hx   . Varicose Veins Neg Hx     Social History Social History   Tobacco Use  . Smoking status: Never Smoker  . Smokeless tobacco: Never Used  Substance Use Topics  . Alcohol use: No    Comment: occ wine (rare)   . Drug use: No     Allergies   Patient has no known allergies.   Review of Systems Review of Systems  Constitutional: Negative for fever.  Respiratory: Negative for cough.   Cardiovascular: Negative for chest pain.  Gastrointestinal: Positive for abdominal pain. Negative for constipation, diarrhea and vomiting.  Genitourinary: Positive for pelvic pain and vaginal discharge. Negative for dysuria and vaginal bleeding.   All other systems reviewed and are negative.    Physical Exam Updated Vital Signs BP 103/73 (BP Location: Left Arm)   Pulse 65   Temp 98.4 F (36.9 C) (Oral)   Resp 16   LMP 10/18/2018 (Within Days)   SpO2 96%   Physical Exam CONSTITUTIONAL: Well developed/well nourished HEAD: Normocephalic/atraumatic EYES: EOMI/PERRL ENMT: Mucous membranes moist NECK: supple no meningeal signs SPINE/BACK:entire spine nontender CV: S1/S2 noted, no murmurs/rubs/gallops noted LUNGS: Lungs are clear to auscultation bilaterally, no apparent distress ABDOMEN: soft, nontender, no rebound or guarding, bowel sounds noted throughout abdomen GU:no cva tenderness , minimal clear discharge, no bleeding, no CMT, no adnexal mass or tenderness, no herpetic lesions, nurse chaperone present NEURO: Pt is awake/alert/appropriate, moves all extremitiesx4.  No facial droop.   EXTREMITIES: pulses normal/equal, full ROM SKIN: warm, color normal PSYCH: no abnormalities of mood noted, alert and oriented to situation   ED Treatments / Results  Labs (all labs ordered are listed, but only abnormal results are displayed) Labs Reviewed  WET PREP, GENITAL - Abnormal; Notable for the following components:      Result Value   Clue Cells Wet Prep HPF POC PRESENT (*)    WBC, Wet Prep HPF POC FEW (*)    All other components within normal limits  URINALYSIS, ROUTINE W REFLEX MICROSCOPIC - Abnormal; Notable for the following components:   Color, Urine STRAW (*)    APPearance HAZY (*)    Specific Gravity, Urine 1.003 (*)    Hgb urine dipstick SMALL (*)    Nitrite POSITIVE (*)    Leukocytes,Ua LARGE (*)    Bacteria, UA RARE (*)    All other components within normal limits  BASIC METABOLIC PANEL - Abnormal; Notable for the following components:   Potassium 3.2 (*)    Chloride 112 (*)    Glucose, Bld 112 (*)    Calcium 8.8 (*)    All other components within normal limits  URINE CULTURE  CBC WITH DIFFERENTIAL/PLATELET   PREGNANCY, URINE  GC/CHLAMYDIA PROBE AMP ([place]) NOT AT [facility]    EKG None  Radiology No results found.  Procedures Procedures   Medications Ordered in ED Medications  HYDROcodone-acetaminophen (NORCO/VICODIN) 5-325 MG per tablet 1 tablet (1 tablet Oral Given 11/13/18 2354)     Initial Impression / Assessment and Plan / ED Course  I have reviewed the triage vital signs and the nursing notes.  Pertinent labs & imaging results that were available during my care of the  patient were reviewed by me and considered in my medical decision making (see chart for details).        11:47 PM Patient presents with pelvic and back pain.  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  She is prone to frequent UTIs.  Overall she is well-appearing, not septic appearing.  No significant abdominal tenderness.  Labs and urinalysis are pending 12:53 AM Patient appears to have UTI.  Due to frequent UTIs, will place on course of Keflex for 10 days.  Urine culture sent.  She has been referred back to urology due to duplicated collecting system.  She also has evidence of bacterial vaginosis.  Will place her on Flagyl for 7 days.  Patient is awake alert, no acute distress.  Vitals are appropriate she is not septic appearing.  she is stable for outpatient management  Final Clinical Impressions(s) / ED Diagnoses   Final diagnoses:  Acute cystitis without hematuria  Bacterial vaginosis    ED Discharge Orders         Ordered    cephALEXin (KEFLEX) 500 MG capsule  3 times daily     11/14/18 0050    metroNIDAZOLE (FLAGYL) 500 MG tablet  2 times daily     11/14/18 0050           Zadie Rhine, MD 11/14/18 [phone]

## 2018-11-13 NOTE — ED Triage Notes (Signed)
Pt reports abdominal pain that starts right below her umbilicus and spreads down into her pelvic region. Endorses tenderness. She denies dysuria, nausea, vomiting, or diarrhea. A&Ox4.

## 2018-11-14 LAB — CBC WITH DIFFERENTIAL/PLATELET
Abs Immature Granulocytes: 0.01 10*3/uL (ref 0.00–0.07)
Basophils Absolute: 0 10*3/uL (ref 0.0–0.1)
Basophils Relative: 1 %
Eosinophils Absolute: 0.1 10*3/uL (ref 0.0–0.5)
Eosinophils Relative: 2 %
HCT: 36.3 % (ref 36.0–46.0)
Hemoglobin: 12 g/dL (ref 12.0–15.0)
Immature Granulocytes: 0 %
Lymphocytes Relative: 41 %
Lymphs Abs: 2.5 10*3/uL (ref 0.7–4.0)
MCH: 29.6 pg (ref 26.0–34.0)
MCHC: 33.1 g/dL (ref 30.0–36.0)
MCV: 89.4 fL (ref 80.0–100.0)
Monocytes Absolute: 0.4 10*3/uL (ref 0.1–1.0)
Monocytes Relative: 7 %
Neutro Abs: 3 10*3/uL (ref 1.7–7.7)
Neutrophils Relative %: 49 %
Platelets: 398 10*3/uL (ref 150–400)
RBC: 4.06 MIL/uL (ref 3.87–5.11)
RDW: 13 % (ref 11.5–15.5)
WBC: 6 10*3/uL (ref 4.0–10.5)
nRBC: 0 % (ref 0.0–0.2)

## 2018-11-14 LAB — PREGNANCY, URINE: Preg Test, Ur: NEGATIVE

## 2018-11-14 LAB — URINALYSIS, ROUTINE W REFLEX MICROSCOPIC
Bilirubin Urine: NEGATIVE
Glucose, UA: NEGATIVE mg/dL
Ketones, ur: NEGATIVE mg/dL
Nitrite: POSITIVE — AB
Protein, ur: NEGATIVE mg/dL
Specific Gravity, Urine: 1.003 — ABNORMAL LOW (ref 1.005–1.030)
pH: 8 (ref 5.0–8.0)

## 2018-11-14 LAB — WET PREP, GENITAL
Sperm: NONE SEEN
Trich, Wet Prep: NONE SEEN
Yeast Wet Prep HPF POC: NONE SEEN

## 2018-11-14 LAB — BASIC METABOLIC PANEL
Anion gap: 7 (ref 5–15)
BUN: 8 mg/dL (ref 6–20)
CO2: 23 mmol/L (ref 22–32)
Calcium: 8.8 mg/dL — ABNORMAL LOW (ref 8.9–10.3)
Chloride: 112 mmol/L — ABNORMAL HIGH (ref 98–111)
Creatinine, Ser: 0.68 mg/dL (ref 0.44–1.00)
GFR calc Af Amer: >60 mL/min (ref >60)
GFR calc non Af Amer: >60 mL/min (ref >60)
Glucose, Bld: 112 mg/dL — ABNORMAL HIGH (ref 70–99)
Potassium: 3.2 mmol/L — ABNORMAL LOW (ref 3.5–5.1)
Sodium: 142 mmol/L (ref 135–145)

## 2018-11-14 MED ORDER — CEPHALEXIN 500 MG PO CAPS: 500.0000 mg | ORAL_CAPSULE | Freq: Three times a day (TID) | ORAL | 0 refills | Status: DC

## 2018-11-14 MED ORDER — METRONIDAZOLE 500 MG PO TABS: 500.0000 mg | ORAL_TABLET | Freq: Two times a day (BID) | ORAL | 0 refills | Status: DC

## 2018-11-14 MED ORDER — POTASSIUM CHLORIDE CRYS ER 20 MEQ PO TBCR
40.0000 meq | EXTENDED_RELEASE_TABLET | Freq: Once | ORAL | Status: AC
  Administered 2018-11-14: 01:00:00 40 meq via ORAL
  Filled 2018-11-14: qty 2

## 2018-11-14 NOTE — ED Notes (Signed)
Requested supplies at bedside 

## 2018-11-14 NOTE — ED Notes (Signed)
Pt verbalized discharge instructions and follow up care. Alert and ambulatory. No IV.  

## 2018-11-15 LAB — URINE CULTURE: Culture: <10000 — AB

## 2018-11-15 LAB — GC/CHLAMYDIA PROBE AMP (~~LOC~~) NOT AT ARMC
Chlamydia: NEGATIVE
Neisseria Gonorrhea: NEGATIVE

## 2018-12-12 ENCOUNTER — Ambulatory Visit: Payer: Medicaid Other

## 2018-12-13 ENCOUNTER — Ambulatory Visit: Payer: Medicaid Other

## 2018-12-18 ENCOUNTER — Emergency Department (HOSPITAL_COMMUNITY): Payer: Medicaid Other

## 2018-12-18 ENCOUNTER — Emergency Department (HOSPITAL_COMMUNITY)
Admission: EM | Admit: 2018-12-18 | Discharge: 2018-12-19 | Disposition: A | Discharge status: Home / Self Care | Payer: Medicaid Other | Attending: Emergency Medicine | Admitting: Emergency Medicine

## 2018-12-18 ENCOUNTER — Other Ambulatory Visit: Payer: Self-pay

## 2018-12-18 ENCOUNTER — Encounter (HOSPITAL_COMMUNITY): Payer: Self-pay

## 2018-12-18 DIAGNOSIS — R109 Unspecified abdominal pain: Secondary | ICD-10-CM | POA: Diagnosis not present

## 2018-12-18 DIAGNOSIS — N12 Tubulo-interstitial nephritis, not specified as acute or chronic: Secondary | ICD-10-CM

## 2018-12-18 DIAGNOSIS — N898 Other specified noninflammatory disorders of vagina: Secondary | ICD-10-CM | POA: Insufficient documentation

## 2018-12-18 LAB — BASIC METABOLIC PANEL
Anion gap: 10 (ref 5–15)
BUN: 10 mg/dL (ref 6–20)
CO2: 23 mmol/L (ref 22–32)
Calcium: 9.6 mg/dL (ref 8.9–10.3)
Chloride: 105 mmol/L (ref 98–111)
Creatinine, Ser: 0.68 mg/dL (ref 0.44–1.00)
GFR calc Af Amer: >60 mL/min (ref >60)
GFR calc non Af Amer: >60 mL/min (ref >60)
Glucose, Bld: 90 mg/dL (ref 70–99)
Potassium: 3.7 mmol/L (ref 3.5–5.1)
Sodium: 138 mmol/L (ref 135–145)

## 2018-12-18 LAB — WET PREP, GENITAL
Clue Cells Wet Prep HPF POC: NONE SEEN
Sperm: NONE SEEN
Trich, Wet Prep: NONE SEEN
Yeast Wet Prep HPF POC: NONE SEEN

## 2018-12-18 LAB — URINALYSIS, ROUTINE W REFLEX MICROSCOPIC
Bilirubin Urine: NEGATIVE
Glucose, UA: NEGATIVE mg/dL
Ketones, ur: NEGATIVE mg/dL
Nitrite: NEGATIVE
Protein, ur: NEGATIVE mg/dL
RBC / HPF: >50 RBC/hpf — ABNORMAL HIGH (ref 0–5)
Specific Gravity, Urine: 1.006 (ref 1.005–1.030)
Squamous Epithelial / HPF: >50 — ABNORMAL HIGH (ref 0–5)
WBC, UA: >50 WBC/hpf — ABNORMAL HIGH (ref 0–5)
pH: 7 (ref 5.0–8.0)

## 2018-12-18 LAB — CBC
HCT: 38.3 % (ref 36.0–46.0)
Hemoglobin: 12.7 g/dL (ref 12.0–15.0)
MCH: 28.7 pg (ref 26.0–34.0)
MCHC: 33.2 g/dL (ref 30.0–36.0)
MCV: 86.5 fL (ref 80.0–100.0)
Platelets: 385 10*3/uL (ref 150–400)
RBC: 4.43 MIL/uL (ref 3.87–5.11)
RDW: 12.8 % (ref 11.5–15.5)
WBC: 6.8 10*3/uL (ref 4.0–10.5)
nRBC: 0 % (ref 0.0–0.2)

## 2018-12-18 LAB — I-STAT BETA HCG BLOOD, ED (MC, WL, AP ONLY): I-stat hCG, quantitative: <5 m[IU]/mL (ref <5)

## 2018-12-18 MED ORDER — BACITRACIN ZINC 500 UNIT/GM EX OINT: TOPICAL_OINTMENT | Freq: Two times a day (BID) | CUTANEOUS | Status: DC

## 2018-12-18 MED ORDER — SODIUM CHLORIDE 0.9 % IV BOLUS
500.0000 mL | Freq: Once | INTRAVENOUS | Status: AC
  Administered 2018-12-18: 500 mL via INTRAVENOUS

## 2018-12-18 MED ORDER — IOHEXOL 240 MG/ML SOLN: 50.0000 mL | Freq: Once | INTRAMUSCULAR | Status: DC | PRN

## 2018-12-18 MED ORDER — IOHEXOL 300 MG/ML  SOLN
50.0000 mL | Freq: Once | INTRAMUSCULAR | Status: AC | PRN
  Administered 2018-12-18: 21:00:00 30 mL via ORAL

## 2018-12-18 MED ORDER — MORPHINE SULFATE (PF) 4 MG/ML IV SOLN
4.0000 mg | Freq: Once | INTRAVENOUS | Status: AC
  Administered 2018-12-18: 19:00:00 4 mg via INTRAVENOUS
  Filled 2018-12-18: qty 1

## 2018-12-18 MED ORDER — KETOROLAC TROMETHAMINE 30 MG/ML IJ SOLN
30.0000 mg | Freq: Once | INTRAMUSCULAR | Status: AC
  Administered 2018-12-18: 23:00:00 30 mg via INTRAVENOUS
  Filled 2018-12-18: qty 1

## 2018-12-18 MED ORDER — ACETAMINOPHEN 325 MG PO TABS
650.0000 mg | ORAL_TABLET | Freq: Once | ORAL | Status: AC
  Administered 2018-12-18: 650 mg via ORAL
  Filled 2018-12-18: qty 2

## 2018-12-18 MED ORDER — SODIUM CHLORIDE 0.9 % IV SOLN
1.0000 g | Freq: Once | INTRAVENOUS | Status: AC
  Administered 2018-12-18: 23:00:00 1 g via INTRAVENOUS
  Filled 2018-12-18: qty 10

## 2018-12-18 MED ORDER — IOHEXOL 300 MG/ML  SOLN
100.0000 mL | Freq: Once | INTRAMUSCULAR | Status: AC | PRN
  Administered 2018-12-18: 100 mL via INTRAVENOUS

## 2018-12-18 MED ORDER — SODIUM CHLORIDE 0.9 % IV BOLUS
1000.0000 mL | Freq: Once | INTRAVENOUS | Status: AC
  Administered 2018-12-18: 1000 mL via INTRAVENOUS

## 2018-12-18 NOTE — ED Triage Notes (Signed)
Pt states c/o right sided flank pain x 3 days. Pt has hx of 3 ureters. Pt tearful in triage.

## 2018-12-18 NOTE — Discharge Instructions (Addendum)
You have been seen today for abdominal and flank pain. Please read and follow all provided instructions.   1. Medications: bactrim (antibiotic), usual home medications 2. Treatment: rest, drink plenty of fluids 3. Follow Up: Please follow up with your primary doctor in 2 days for discussion of your diagnoses and further evaluation after today's visit; if you do not have a primary care doctor use the resource guide provided to find one; Please return to the ER for any new or worsening symptoms. Please obtain all of your results from medical records or have your doctors office obtain the results - share them with your doctor - you should be seen at your doctors office. Call today to arrange your follow up.   Take medications as prescribed. Please review all of the medicines and only take them if you do not have an allergy to them. Return to the emergency room for worsening condition or new concerning symptoms. Follow up with your regular doctor. If you don't have a regular doctor use one of the numbers below to establish a primary care doctor.  Please be aware that if you are taking birth control pills, taking other prescriptions, ESPECIALLY ANTIBIOTICS may make the birth control ineffective - if this is the case, either do not engage in sexual activity or use alternative methods of birth control such as condoms until you have finished the medicine and your family doctor says it is OK to restart them. If you are on a blood thinner such as COUMADIN, be aware that any other medicine that you take may cause the coumadin to either work too much, or not enough - you should have your coumadin level rechecked in next 7 days if this is the case.  ?  It is also a possibility that you have an allergic reaction to any of the medicines that you have been prescribed - Everybody reacts differently to medications and while MOST people have no trouble with most medicines, you may have a reaction such as nausea, vomiting,  rash, swelling, shortness of breath. If this is the case, please stop taking the medicine immediately and contact your physician.  ?  You should return to the ER if you develop severe or worsening symptoms.   Emergency Department Resource Guide 1) Find a Doctor and Pay Out of Pocket Although you won't have to find out who is covered by your insurance plan, it is a good idea to ask around and get recommendations. You will then need to call the office and see if the doctor you have chosen will accept you as a new patient and what types of options they offer for patients who are self-pay. Some doctors offer discounts or will set up payment plans for their patients who do not have insurance, but you will need to ask so you aren't surprised when you get to your appointment.  2) Contact Your Local Health Department Not all health departments have doctors that can see patients for sick visits, but many do, so it is worth a call to see if yours does. If you don't know where your local health department is, you can check in your phone book. The CDC also has a tool to help you locate your state's health department, and many state websites also have listings of all of their local health departments.  3) Find a [facility] If your illness is not likely to be very severe or complicated, you may want to try a walk in clinic. These are  popping up all over the country in pharmacies, drugstores, and shopping centers. They're usually staffed by nurse practitioners or physician assistants that have been trained to treat common illnesses and complaints. They're usually fairly quick and inexpensive. However, if you have serious medical issues or chronic medical problems, these are probably not your best option.  No Primary Care Doctor: Call [facility] Connect at  [phone] - they can help you locate a primary care doctor that  accepts your insurance, provides certain services, etc. Physician Referral Service-  [phone]  Chronic Pain Problems: Organization         Address  Phone   Notes  [facility]  [phone] Patients need to be referred by their primary care doctor.   Medication Assistance: Organization         Address  Phone   Notes  [facility] Medication [facility] [address] [place], [place] [address] [phone] --Must be a resident of [facility] -- Must have NO insurance coverage whatsoever (no Medicaid/ Medicare, etc.) -- The pt. MUST have a primary care doctor that directs their care regularly and follows them in the community   [facility]  [phone]   [employer]  [phone]    Agencies that provide inexpensive medical care: Organization         Address  Phone   Notes  [place]  [phone]   [facility]    [phone]   [facility] [place], [place] [address] [phone]   [place] [address], [place] [phone]   [facility]    [phone]   [facility]    [phone]   [place] [facility] [address], [place] Phone:  [phone], Fax:  [phone] Hours of Operation:  9 am - 6 pm, M-F.  Also accepts Medicaid/Medicare and self-pay.  [facility] for [place] [address], [address], [place] Phone: [phone], Fax: [phone]. Hours of Operation:  8:30 am - 5:30 pm, M-F.  Also accepts Medicaid and self-pay.  [facility] [address] [place] Phone: [phone]   [place], [place], [place] [phone], Ext. [phone] Mondays & Thursdays: 7-9 AM.  First 15 patients are seen on a first come, first serve basis.    [place] Providers:  Organization         Address  Phone   Notes  [facility] [address],  [place] [phone] Also accepts self-pay patients.  [facility] [address], [place]  [phone]   [place], [address], [place] [phone]   [facility] [address] [place] [phone]   Lucianne Lei [address], [place]   [phone] Only accepts [place] patients after they have their name applied to their card.   Self-Pay (no insurance) in [facility]:  Organization         Address  Phone   Notes  Sickle Cell Patients, [facility] [place] [phone]   [facility] [place] [phone]   [facility]  [address] [place]  [address], [place] [phone]   [facility]/Dr. Osei-Bonsu  [address], [place] or [address], [place] [phone] Phone number for both [place] and [place] locations is the same.  [facility] [address], [place] [phone]   [facility] [address] [place] or [address] [phone]   [facility] [address] [place] [phone], phone; [phone], fax Sees patients 1st and 3rd Saturday of every month.  Must not qualify for public or private insurance (i.e. Medicaid, Medicare, [place] Health Choice, Veterans' Benefits)  Household income should be no more than 200% of the poverty level The clinic cannot treat you if you are pregnant or think you are pregnant  Sexually transmitted diseases are not treated at the clinic.

## 2018-12-18 NOTE — ED Provider Notes (Signed)
[facility]-EMERGENCY DEPT Provider Note   CSN: [id number] Arrival date & time: 12/18/18  1641    History   Chief Complaint Chief Complaint  Patient presents with   Flank Pain    right    HPI Alisha Wall is a 28 y.o. female with a PMH of anxiety, depression, UTI, and duplicated right kidney collecting system presenting with constant sharp right flank pain radiating to right lower back onset 3 days ago. Patient reports urinating alleviates the pain and movement makes symptoms worse. Patient reports taking ibuprofen without relief. Patient reports appetite loss and fever onset today. Patient reports urinary frequency, but denies dysuria or hematuria. Patient denies cough, congestion, sick contacts or recent travel. LMP 06/05. Patient denies vaginal discharge. Patient denies nausea, vomiting, diarrhea, or constipation. Patient denies any abdominal surgeries.      HPI  Past Medical History:  Diagnosis Date   Anxiety    Depression    UTI (lower urinary tract infection)     Patient Active Problem List   Diagnosis Date Noted   Hypokalemia 10/27/2017   Hypotension 10/26/2017   Nausea and vomiting 10/26/2017   Viral gastroenteritis 10/26/2017   Abdominal pain 10/26/2017   Anxiety and depression 10/26/2017   Normocytic anemia 10/26/2017   Fecal occult blood test positive 10/26/2017   NSVD (normal spontaneous vaginal delivery) 11/27/2015   Active labor at term 11/26/2015    Past Surgical History:  Procedure Laterality Date   INDUCED ABORTION       OB History    Gravida  5   Para  3   Term  3   Preterm      AB  2   Living  3     SAB  1   TAB  1   Ectopic      Multiple  0   Live Births  3            Home Medications    Prior to Admission medications   Medication Sig Start Date End Date Taking? Authorizing Provider  cephALEXin (KEFLEX) 500 MG capsule Take 1 capsule (500 mg total) by mouth 3 (three) times  daily. Patient not taking: Reported on 12/18/2018 11/14/18   Zadie RhineWickline, Donald, MD  hydrocortisone (CORTEF) 5 MG tablet Take 1 tablet (5 mg total) by mouth 2 (two) times daily. Patient not taking: Reported on 10/07/2018 10/28/17   Marguerita MerlesSheikh, Omair Latif, DO  metroNIDAZOLE (FLAGYL) 500 MG tablet Take 1 tablet (500 mg total) by mouth 2 (two) times daily. One po bid x 7 days Patient not taking: Reported on 12/18/2018 11/14/18   Zadie RhineWickline, Donald, MD  nitrofurantoin, macrocrystal-monohydrate, (MACROBID) 100 MG capsule Take 1 capsule (100 mg total) by mouth 2 (two) times daily. Patient not taking: Reported on 12/18/2018 10/07/18   Brock BadHarper, Charles A, MD  Prenat-FeAsp-Meth-FA-DHA w/o A (PRENATE PIXIE) 10-0.6-0.4-200 MG CAPS Take 1 capsule by mouth daily before breakfast. Patient not taking: Reported on 10/07/2018 05/30/17   Brock BadHarper, Charles A, MD  sulfamethoxazole-trimethoprim (BACTRIM DS) 800-160 MG tablet Take 1 tablet by mouth 2 (two) times daily for 7 days. 12/19/18 12/26/18  Leretha DykesHernandez, Pinchos Topel P, PA-C    Family History Family History  Problem Relation Age of Onset   Alcohol abuse Neg Hx    Arthritis Neg Hx    Asthma Neg Hx    Cancer Neg Hx    Birth defects Neg Hx    COPD Neg Hx    Depression Neg Hx  Diabetes Neg Hx    Drug abuse Neg Hx    Early death Neg Hx    Hearing loss Neg Hx    Heart disease Neg Hx    Hyperlipidemia Neg Hx    Hypertension Neg Hx    Kidney disease Neg Hx    Learning disabilities Neg Hx    Mental illness Neg Hx    Mental retardation Neg Hx    Miscarriages / Stillbirths Neg Hx    Stroke Neg Hx    Vision loss Neg Hx    Varicose Veins Neg Hx     Social History Social History   Tobacco Use   Smoking status: Never Smoker   Smokeless tobacco: Never Used  Substance Use Topics   Alcohol use: No    Comment: occ wine (rare)    Drug use: No     Allergies   Patient has no known allergies.   Review of Systems Review of Systems  Constitutional:  Positive for appetite change. Negative for activity change, chills, fever and unexpected weight change.  HENT: Negative for congestion, rhinorrhea and sore throat.   Eyes: Negative for visual disturbance.  Respiratory: Negative for cough and shortness of breath.   Cardiovascular: Negative for chest pain.  Gastrointestinal: Positive for abdominal pain. Negative for constipation, diarrhea, nausea and vomiting.  Endocrine: Negative for polydipsia, polyphagia and polyuria.  Genitourinary: Positive for flank pain and frequency. Negative for difficulty urinating, dysuria, vaginal bleeding and vaginal discharge.  Musculoskeletal: Negative for back pain.  Skin: Negative for rash.  Allergic/Immunologic: Negative for immunocompromised state.  Neurological: Negative for weakness.  Psychiatric/Behavioral: The patient is not nervous/anxious.      Physical Exam Updated Vital Signs BP (!) 118/91 (BP Location: Right Arm)    Pulse 68    Temp 98 F (36.7 C) (Oral)    Resp 16    Ht 5\' 6"  (1.676 m)    Wt 71 kg    LMP 11/22/2018    SpO2 100%    BMI 25.28 kg/m   Physical Exam Vitals signs and nursing note reviewed. Exam conducted with a chaperone present.  Constitutional:      General: She is not in acute distress.    Appearance: She is well-developed. She is not diaphoretic.     Comments: Patient appears uncomfortable due to right sided abdominal pain and is tearful during history.  HENT:     Head: Normocephalic and atraumatic.     Nose: Nose normal. No congestion or rhinorrhea.     Mouth/Throat:     Mouth: Mucous membranes are dry.     Pharynx: No oropharyngeal exudate or posterior oropharyngeal erythema.  Eyes:     Extraocular Movements: Extraocular movements intact.     Conjunctiva/sclera: Conjunctivae normal.     Pupils: Pupils are equal, round, and reactive to light.  Neck:     Musculoskeletal: Normal range of motion and neck supple.  Cardiovascular:     Rate and Rhythm: Normal rate and  regular rhythm.     Heart sounds: Normal heart sounds. No murmur. No friction rub. No gallop.   Pulmonary:     Effort: Pulmonary effort is normal. No respiratory distress.     Breath sounds: Normal breath sounds. No wheezing or rales.  Abdominal:     General: Bowel sounds are normal. There is no distension.     Palpations: Abdomen is soft. Abdomen is not rigid. There is no mass.     Tenderness: There is abdominal tenderness. There  is right CVA tenderness. There is no left CVA tenderness, guarding or rebound.     Hernia: No hernia is present.  Genitourinary:    Labia:        Right: No tenderness.        Left: No tenderness.      Vagina: No signs of injury and foreign body. Vaginal discharge present. No erythema, tenderness, bleeding, lesions or prolapsed vaginal walls.     Cervix: Discharge present. No cervical motion tenderness, friability, lesion, erythema, cervical bleeding or eversion.     Uterus: Normal.      Adnexa: Right adnexa normal and left adnexa normal.     Comments: Thick white discharge noted on exam. No CMT. No bleeding present.  Musculoskeletal: Normal range of motion.  Skin:    General: Skin is warm.     Findings: No rash.  Neurological:     Mental Status: She is alert and oriented to person, place, and time.    ED Treatments / Results  Labs (all labs ordered are listed, but only abnormal results are displayed) Labs Reviewed  WET PREP, GENITAL - Abnormal; Notable for the following components:      Result Value   WBC, Wet Prep HPF POC MANY (*)    All other components within normal limits  URINALYSIS, ROUTINE W REFLEX MICROSCOPIC - Abnormal; Notable for the following components:   APPearance CLOUDY (*)    Hgb urine dipstick MODERATE (*)    Leukocytes,Ua LARGE (*)    RBC / HPF >50 (*)    WBC, UA >50 (*)    Bacteria, UA MANY (*)    Squamous Epithelial / LPF >50 (*)    All other components within normal limits  URINE CULTURE  BASIC METABOLIC PANEL  CBC    I-STAT BETA HCG BLOOD, ED ([facility], [facility], [facility] ONLY)  GC/CHLAMYDIA PROBE AMP ([place]    EKG None  Radiology Ct Abdomen Pelvis W Contrast  Result Date: 12/18/2018 CLINICAL DATA:  Right side abdominal pain EXAM: CT ABDOMEN AND PELVIS WITH CONTRAST TECHNIQUE: Multidetector CT imaging of the abdomen and pelvis was performed using the standard protocol following bolus administration of intravenous contrast. CONTRAST:  30mL OMNIPAQUE IOHEXOL 300 MG/ML SOLN, 100mL OMNIPAQUE IOHEXOL 300 MG/ML SOLN COMPARISON:  10/26/2017 FINDINGS: Lower chest: Lung bases are clear. No effusions. Heart is normal size. Hepatobiliary: No focal hepatic abnormality. Gallbladder unremarkable. Pancreas: No focal abnormality or ductal dilatation. Spleen: No focal abnormality.  Normal size. Adrenals/Urinary Tract: Again noted is the duplicated right renal collecting system and ureter. The upper pole moiety remains obstructed with ectopic insertion of the ureter into the bladder. Nonobstructing stone in the upper pole of the left kidney. There is upper pole caliectasis on the left, stable. No ureteral stones. Urinary bladder and adrenal glands unremarkable. Stomach/Bowel: Normal appendix. Stomach, large and small bowel grossly unremarkable. Vascular/Lymphatic: No evidence of aneurysm or adenopathy. Reproductive: Uterus and adnexa unremarkable.  No mass. Other: No free fluid or free air. Musculoskeletal: No acute bony abnormality. IMPRESSION: Complete duplication of the right ureter. The upper pole moiety and ureter remains obstructed, unchanged since prior study. Left upper pole nephrolithiasis. There is caliectasis in the upper pole of the left kidney which is stable since prior study, raising the possibility of duplicated collecting system on the left. This is unchanged. No ureteral stones. Normal appendix. No acute findings in the abdomen or pelvis. Electronically Signed   By: Charlett NoseKevin  Dover M.D.   On: 12/18/2018  22:14     Procedures Procedures (including critical care time)  Medications Ordered in ED Medications  acetaminophen (TYLENOL) tablet 650 mg (650 mg Oral Given 12/18/18 1900)  sodium chloride 0.9 % bolus 500 mL (0 mLs Intravenous Stopped 12/18/18 2126)  morphine 4 MG/ML injection 4 mg (4 mg Intravenous Given 12/18/18 1901)  cefTRIAXone (ROCEPHIN) 1 g in sodium chloride 0.9 % 100 mL IVPB (0 g Intravenous Stopped 12/18/18 2323)  iohexol (OMNIPAQUE) 300 MG/ML solution 100 mL (100 mLs Intravenous Contrast Given 12/18/18 2159)  iohexol (OMNIPAQUE) 300 MG/ML solution 50 mL (30 mLs Oral Contrast Given 12/18/18 2120)  sodium chloride 0.9 % bolus 1,000 mL (0 mLs Intravenous Stopped 12/19/18 0044)  ketorolac (TORADOL) 30 MG/ML injection 30 mg (30 mg Intravenous Given 12/18/18 2301)    Initial Impression / Assessment and Plan / ED Course  I have reviewed the triage vital signs and the nursing notes.  Pertinent labs & imaging results that were available during my care of the patient were reviewed by me and considered in my medical decision making (see chart for details).  Clinical Course as of Dec 18 108  Wed Dec 18, 2018  1925 CBC is unremarkable.  CBC [AH]  1925 BMP is within normal limits.  Basic metabolic panel [AH]  2004 Reassessed patient. Patient states symptoms have significantly improved, but patient continues to have RLQ abdominal pain with palpation.   [AH]  2042 Moderate Hgb, large leukocytes, RBCs, WBCs, and many bacteria noted on UA. Sample has multiple squamous epithelial cells.   Hgb urine dipstickMarland Kitchen): MODERATE [AH]  2108 Many WBCs noted on wet prep.  WBC, Wet Prep HPF POC(!): MANY [AH]  2219 Complete duplication of the right ureter. The upper pole moiety and ureter remains obstructed, unchanged since prior study.  Left upper pole nephrolithiasis. There is caliectasis in the upper pole of the left kidney which is stable since prior study, raising the possibility of duplicated collecting system on  the left. This is unchanged. No ureteral stones.    CT Abdomen Pelvis W Contrast [AH]  2334 Reassessed patient. Patient is stable in no acute distress at this time. Patient denies any symptoms currently.    [AH]    Clinical Course User Index [AH] Leretha Dykes, PA-C      Patient presents with right sided abdominal and flank pain. Patient had a fever while in the ER. WBCs are within normal limits. Provided IVF and pain management while in the ER. UA reveals many bacteria, large leukocytes, RBCs, and WBCs. Patient had right sided CVA tenderness. Suspect symptoms are likely due to pyelonephritis. Ordered urine culture. CT abdomen reveals unchanged complete duplication of the right ureter. Left upper pole nonobstructing nephrolithiasis noted. No ureteral stones noted. Symptoms have significantly improved while in the ER. Provided IV ceftriaxone due to concern for pyelonephritis. Patient is nontoxic, nonseptic appearing, in no apparent distress.  Patient's pain and other symptoms adequately managed in emergency department. Labs, imaging and vitals reviewed.  Patient does not meet the SIRS or Sepsis criteria.  On repeat exam patient does not have a surgical abdomin and there are no peritoneal signs.  No indication of appendicitis, bowel obstruction, bowel perforation, cholecystitis, diverticulitis, PID or ectopic pregnancy. Prescribed bactrim for pyelonephritis. Discussed strict return precautions with patient. Patient discharged home with symptomatic treatment and given strict instructions for follow-up with their primary care physician and urologist. Patient reports she has a urologist appointment later this month.  I have also discussed reasons to return  immediately to the ER.  Patient expresses understanding and agrees with plan.  Findings and plan of care discussed with supervising physician Dr. Criss AlvineGoldston.   Final Clinical Impressions(s) / ED Diagnoses   Final diagnoses:  Right flank pain   Pyelonephritis  Right sided abdominal pain    ED Discharge Orders         Ordered    sulfamethoxazole-trimethoprim (BACTRIM DS) 800-160 MG tablet  2 times daily     12/19/18 0051           Leretha DykesHernandez, Kamalei Roeder P, PA-C 12/19/18 0110    Pricilla LovelessGoldston, Scott, MD 12/19/18 [phone]

## 2018-12-19 DIAGNOSIS — N898 Other specified noninflammatory disorders of vagina: Secondary | ICD-10-CM | POA: Diagnosis not present

## 2018-12-19 DIAGNOSIS — N12 Tubulo-interstitial nephritis, not specified as acute or chronic: Secondary | ICD-10-CM | POA: Diagnosis not present

## 2018-12-19 LAB — GC/CHLAMYDIA PROBE AMP (~~LOC~~) NOT AT ARMC
Chlamydia: NEGATIVE
Neisseria Gonorrhea: NEGATIVE

## 2018-12-19 MED ORDER — SULFAMETHOXAZOLE-TRIMETHOPRIM 800-160 MG PO TABS: 1.0000 | ORAL_TABLET | Freq: Two times a day (BID) | ORAL | 0 refills | Status: AC

## 2018-12-20 LAB — URINE CULTURE: Culture: >=100000 — AB

## 2018-12-21 ENCOUNTER — Telehealth: Payer: Self-pay | Admitting: Emergency Medicine

## 2018-12-21 NOTE — Telephone Encounter (Signed)
Post ED Visit - Positive Culture Follow-up  Culture report reviewed by antimicrobial stewardship pharmacist: [place] [facility] []  Elenor Quinones, Pharm.D. []  Heide Guile, Pharm.D., BCPS AQ-ID []  Parks Neptune, Pharm.D., BCPS []  Alycia Rossetti, Pharm.D., BCPS []  [place], Pharm.D., BCPS, AAHIVP []  Legrand Como, Pharm.D., BCPS, AAHIVP []  Salome Arnt, PharmD, BCPS []  Johnnette Gourd, PharmD, BCPS []  Hughes Better, PharmD, BCPS []  Leeroy Cha, PharmD []  Laqueta Linden, PharmD, BCPS []  Albertina Parr, PharmD  [place] Team []  Leodis Sias, PharmD []  Lindell Spar, PharmD []  Royetta Asal, PharmD []  Graylin Shiver, Rph []  Rema Fendt) Glennon Mac, PharmD []  Arlyn Dunning, PharmD []  Netta Cedars, PharmD []  Dia Sitter, PharmD []  Leone Haven, PharmD []  Gretta Arab, PharmD [x]  Theodis Shove, PharmD []  Peggyann Juba, PharmD []  Reuel Boom, PharmD   Positive urine culture Treated with Sulfamethoxazole-trimethoprim, no further patient follow-up is required at this time.  Sandi Raveling Jakobie Henslee 12/21/2018, 12:58 PM

## 2019-01-01 ENCOUNTER — Telehealth: Payer: Self-pay

## 2019-01-01 NOTE — Telephone Encounter (Signed)
Covid-19 screening questions   Do you now or have you had a fever in the last 14 days?  Do you have any respiratory symptoms of shortness of breath or cough now or in the last 14 days?  Do you have any family members or close contacts with diagnosed or suspected Covid-19 in the past 14 days?  Have you been tested for Covid-19 and found to be positive?  Were you referred for your appointment?  Called but voicemail is full

## 2019-01-03 ENCOUNTER — Ambulatory Visit: Payer: Medicaid Other | Admitting: Gastroenterology

## 2019-04-17 ENCOUNTER — Encounter: Payer: Self-pay | Admitting: Gastroenterology

## 2019-04-18 ENCOUNTER — Ambulatory Visit: Payer: Medicaid Other

## 2019-04-21 ENCOUNTER — Ambulatory Visit: Payer: Medicaid Other

## 2019-04-25 ENCOUNTER — Encounter: Payer: Self-pay | Admitting: Gastroenterology

## 2019-05-23 ENCOUNTER — Other Ambulatory Visit: Payer: Self-pay

## 2019-05-23 ENCOUNTER — Emergency Department (HOSPITAL_COMMUNITY): Payer: Medicaid Other

## 2019-05-23 ENCOUNTER — Encounter (HOSPITAL_COMMUNITY): Payer: Self-pay

## 2019-05-23 ENCOUNTER — Emergency Department (HOSPITAL_COMMUNITY)
Admission: EM | Admit: 2019-05-23 | Discharge: 2019-05-24 | Discharge status: Against Medical Advice | Payer: Medicaid Other | Attending: Emergency Medicine | Admitting: Emergency Medicine

## 2019-05-23 DIAGNOSIS — Z3A09 9 weeks gestation of pregnancy: Secondary | ICD-10-CM | POA: Diagnosis not present

## 2019-05-23 DIAGNOSIS — N1330 Unspecified hydronephrosis: Secondary | ICD-10-CM | POA: Diagnosis not present

## 2019-05-23 DIAGNOSIS — N10 Acute tubulo-interstitial nephritis: Secondary | ICD-10-CM | POA: Diagnosis not present

## 2019-05-23 DIAGNOSIS — Z79899 Other long term (current) drug therapy: Secondary | ICD-10-CM | POA: Insufficient documentation

## 2019-05-23 DIAGNOSIS — N12 Tubulo-interstitial nephritis, not specified as acute or chronic: Secondary | ICD-10-CM

## 2019-05-23 DIAGNOSIS — O26831 Pregnancy related renal disease, first trimester: Secondary | ICD-10-CM | POA: Insufficient documentation

## 2019-05-23 DIAGNOSIS — O99891 Other specified diseases and conditions complicating pregnancy: Secondary | ICD-10-CM | POA: Diagnosis present

## 2019-05-23 DIAGNOSIS — R1031 Right lower quadrant pain: Secondary | ICD-10-CM | POA: Insufficient documentation

## 2019-05-23 DIAGNOSIS — O2301 Infections of kidney in pregnancy, first trimester: Secondary | ICD-10-CM | POA: Diagnosis not present

## 2019-05-23 DIAGNOSIS — Z3490 Encounter for supervision of normal pregnancy, unspecified, unspecified trimester: Secondary | ICD-10-CM

## 2019-05-23 LAB — CBC WITH DIFFERENTIAL/PLATELET
Abs Immature Granulocytes: 0.02 10*3/uL (ref 0.00–0.07)
Basophils Absolute: 0 10*3/uL (ref 0.0–0.1)
Basophils Relative: 0 %
Eosinophils Absolute: 0.1 10*3/uL (ref 0.0–0.5)
Eosinophils Relative: 1 %
HCT: 33.5 % — ABNORMAL LOW (ref 36.0–46.0)
Hemoglobin: 11.3 g/dL — ABNORMAL LOW (ref 12.0–15.0)
Immature Granulocytes: 0 %
Lymphocytes Relative: 25 %
Lymphs Abs: 2 10*3/uL (ref 0.7–4.0)
MCH: 29.7 pg (ref 26.0–34.0)
MCHC: 33.7 g/dL (ref 30.0–36.0)
MCV: 87.9 fL (ref 80.0–100.0)
Monocytes Absolute: 0.7 10*3/uL (ref 0.1–1.0)
Monocytes Relative: 9 %
Neutro Abs: 5.1 10*3/uL (ref 1.7–7.7)
Neutrophils Relative %: 65 %
Platelets: 339 10*3/uL (ref 150–400)
RBC: 3.81 MIL/uL — ABNORMAL LOW (ref 3.87–5.11)
RDW: 13.7 % (ref 11.5–15.5)
WBC: 8 10*3/uL (ref 4.0–10.5)
nRBC: 0 % (ref 0.0–0.2)

## 2019-05-23 LAB — URINALYSIS, ROUTINE W REFLEX MICROSCOPIC
Bilirubin Urine: NEGATIVE
Glucose, UA: NEGATIVE mg/dL
Ketones, ur: NEGATIVE mg/dL
Nitrite: NEGATIVE
Protein, ur: NEGATIVE mg/dL
Specific Gravity, Urine: 1.006 (ref 1.005–1.030)
WBC, UA: >50 WBC/hpf — ABNORMAL HIGH (ref 0–5)
pH: 7 (ref 5.0–8.0)

## 2019-05-23 LAB — COMPREHENSIVE METABOLIC PANEL
ALT: 17 U/L (ref 0–44)
AST: 17 U/L (ref 15–41)
Albumin: 3.8 g/dL (ref 3.5–5.0)
Alkaline Phosphatase: 52 U/L (ref 38–126)
Anion gap: 9 (ref 5–15)
BUN: 8 mg/dL (ref 6–20)
CO2: 24 mmol/L (ref 22–32)
Calcium: 8.8 mg/dL — ABNORMAL LOW (ref 8.9–10.3)
Chloride: 105 mmol/L (ref 98–111)
Creatinine, Ser: 0.53 mg/dL (ref 0.44–1.00)
GFR calc Af Amer: >60 mL/min (ref >60)
GFR calc non Af Amer: >60 mL/min (ref >60)
Glucose, Bld: 90 mg/dL (ref 70–99)
Potassium: 3.2 mmol/L — ABNORMAL LOW (ref 3.5–5.1)
Sodium: 138 mmol/L (ref 135–145)
Total Bilirubin: 0.4 mg/dL (ref 0.3–1.2)
Total Protein: 7.4 g/dL (ref 6.5–8.1)

## 2019-05-23 LAB — LIPASE, BLOOD: Lipase: 43 U/L (ref 11–51)

## 2019-05-23 LAB — PREGNANCY, URINE: Preg Test, Ur: POSITIVE — AB

## 2019-05-23 LAB — HCG, QUANTITATIVE, PREGNANCY: hCG, Beta Chain, Quant, S: 113212 m[IU]/mL — ABNORMAL HIGH (ref <5)

## 2019-05-23 MED ORDER — SODIUM CHLORIDE 0.9 % IV BOLUS
1000.0000 mL | Freq: Once | INTRAVENOUS | Status: AC
  Administered 2019-05-23: 21:00:00 1000 mL via INTRAVENOUS

## 2019-05-23 NOTE — ED Notes (Signed)
Pt made aware urine sample needed 

## 2019-05-23 NOTE — ED Triage Notes (Signed)
Patient c/o right flank pain. Patient states she has a history of frequent UTI's. Patient also reports that she had an appointment with a urologist, but missed it because she could not get off of work.  Patient states she had a + home pregnancy test 04/23/19. Patient states she has missed 2 periods.

## 2019-05-23 NOTE — ED Notes (Signed)
Elizabeth PA at bedside

## 2019-05-23 NOTE — ED Provider Notes (Cosign Needed)
[facility]-EMERGENCY DEPT Provider Note   CSN: [id number] Arrival date & time: 05/23/19  1735     History   Chief Complaint Chief Complaint  Patient presents with  . Flank Pain  . Dysuria    HPI Alisha Wall is a 28 y.o. female [id number] 13+4 who presents today for evaluation of right-sided flank pain.  She reports that she has had right-sided flank pain over the past 4 days.  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  She reports that she has not been able to follow up with urology.   She states that today she had sudden onset of right flank pain.  She states it is in both sides of her back and radiates up to her neck.  She reports severe morning sickness worse than previous pregnancies.  She states that She has not yet seen ob/gyn or had any ultrasounds this pregnancy.   She denies any dysuria, reports frequent urination of small amounts. No fevers.      HPI  Past Medical History:  Diagnosis Date  . Anxiety   . Depression   . UTI (lower urinary tract infection)     Patient Active Problem List   Diagnosis Date Noted  . Hypokalemia 10/27/2017  . Hypotension 10/26/2017  . Nausea and vomiting 10/26/2017  . Viral gastroenteritis 10/26/2017  . Abdominal pain 10/26/2017  . Anxiety and depression 10/26/2017  . Normocytic anemia 10/26/2017  . Fecal occult blood test positive 10/26/2017  . NSVD (normal spontaneous vaginal delivery) 11/27/2015  . Active labor at term 11/26/2015    Past Surgical History:  Procedure Laterality Date  . INDUCED ABORTION       OB History    Gravida  6   Para  3   Term  3   Preterm      AB  2   Living  3     SAB  1   TAB  1   Ectopic      Multiple  0   Live Births  3            Home Medications    Prior to Admission medications   Medication Sig Start Date End Date Taking? Authorizing Provider   Prenat-FeAsp-Meth-FA-DHA w/o A (PRENATE PIXIE) 10-0.6-0.4-200 MG CAPS Take 1 capsule by mouth daily before breakfast. 05/30/17  Yes Brock Bad, MD  cephALEXin (KEFLEX) 500 MG capsule Take 1 capsule (500 mg total) by mouth 3 (three) times daily. Patient not taking: Reported on 12/18/2018 11/14/18   Zadie Rhine, MD  hydrocortisone (CORTEF) 5 MG tablet Take 1 tablet (5 mg total) by mouth 2 (two) times daily. Patient not taking: Reported on 10/07/2018 10/28/17   Marguerita Merles Latif, DO  metroNIDAZOLE (FLAGYL) 500 MG tablet Take 1 tablet (500 mg total) by mouth 2 (two) times daily. One po bid x 7 days Patient not taking: Reported on 12/18/2018 11/14/18   Zadie Rhine, MD  nitrofurantoin, macrocrystal-monohydrate, (MACROBID) 100 MG capsule Take 1 capsule (100 mg total) by mouth 2 (two) times daily. Patient not taking: Reported on 12/18/2018 10/07/18   Brock Bad, MD    Family History Family History  Problem Relation Age of Onset  . Hypertension Mother   . Alcohol abuse Neg Hx   . Arthritis Neg Hx   . Asthma Neg Hx   . Cancer Neg Hx   . Birth defects  Neg Hx   . COPD Neg Hx   . Depression Neg Hx   . Diabetes Neg Hx   . Drug abuse Neg Hx   . Early death Neg Hx   . Hearing loss Neg Hx   . Heart disease Neg Hx   . Hyperlipidemia Neg Hx   . Kidney disease Neg Hx   . Learning disabilities Neg Hx   . Mental illness Neg Hx   . Mental retardation Neg Hx   . Miscarriages / Stillbirths Neg Hx   . Stroke Neg Hx   . Vision loss Neg Hx   . Varicose Veins Neg Hx     Social History Social History   Tobacco Use  . Smoking status: Never Smoker  . Smokeless tobacco: Never Used  Substance Use Topics  . Alcohol use: Not Currently    Comment: occ wine (rare)   . Drug use: No     Allergies   Patient has no known allergies.   Review of Systems Review of Systems  Constitutional: Negative for chills and fever.  Gastrointestinal: Positive for abdominal pain, nausea and vomiting.  Negative for diarrhea.  Genitourinary: Positive for frequency and urgency. Negative for difficulty urinating, dysuria, menstrual problem, pelvic pain, vaginal bleeding and vaginal discharge.  Musculoskeletal: Negative for back pain and neck pain.  Neurological: Negative for weakness and headaches.  Psychiatric/Behavioral: Negative for confusion.  All other systems reviewed and are negative.    Physical Exam Updated Vital Signs BP 98/63   Pulse (!) 109   Temp 98.1 F (36.7 C) (Oral)   Resp 18   Ht 5' 5.5" (1.664 m)   Wt 73.5 kg   LMP 02/18/2019   SpO2 100%   BMI 26.55 kg/m   Physical Exam Vitals signs and nursing note reviewed.  Constitutional:      General: She is not in acute distress.    Appearance: She is well-developed. She is not ill-appearing.  HENT:     Head: Normocephalic and atraumatic.  Eyes:     Conjunctiva/sclera: Conjunctivae normal.  Neck:     Musculoskeletal: Neck supple.  Cardiovascular:     Rate and Rhythm: Normal rate and regular rhythm.     Heart sounds: No murmur.  Pulmonary:     Effort: Pulmonary effort is normal. No respiratory distress.     Breath sounds: Normal breath sounds.  Abdominal:     Palpations: Abdomen is soft.     Tenderness: There is no abdominal tenderness. There is right CVA tenderness. There is no left CVA tenderness.     Comments: Uterus not palpable over pelvic brim  Musculoskeletal:     Comments: Diffuse TTP over bilateral paraspinal muscles of C/T/L spine.  No midline TTP, step offs or deformities.  Palpation here both recreates and exacerbates her reported flank pain.   Skin:    General: Skin is warm and dry.  Neurological:     General: No focal deficit present.     Mental Status: She is alert.     Cranial Nerves: No cranial nerve deficit.     Motor: No weakness.  Psychiatric:        Mood and Affect: Mood normal.      ED Treatments / Results  Labs (all labs ordered are listed, but only abnormal results are  displayed) Labs Reviewed  URINALYSIS, ROUTINE W REFLEX MICROSCOPIC - Abnormal; Notable for the following components:      Result Value   APPearance CLOUDY (*)  Hgb urine dipstick SMALL (*)    Leukocytes,Ua LARGE (*)    WBC, UA >50 (*)    Bacteria, UA MANY (*)    All other components within normal limits  PREGNANCY, URINE - Abnormal; Notable for the following components:   Preg Test, Ur POSITIVE (*)    All other components within normal limits  COMPREHENSIVE METABOLIC PANEL - Abnormal; Notable for the following components:   Potassium 3.2 (*)    Calcium 8.8 (*)    All other components within normal limits  CBC WITH DIFFERENTIAL/PLATELET - Abnormal; Notable for the following components:   RBC 3.81 (*)    Hemoglobin 11.3 (*)    HCT 33.5 (*)    All other components within normal limits  HCG, QUANTITATIVE, PREGNANCY - Abnormal; Notable for the following components:   hCG, Beta Chain, Quant, S 113,212 (*)    All other components within normal limits  URINE CULTURE  LIPASE, BLOOD    EKG None  Radiology No results found.  Procedures Procedures (including critical care time)  Medications Ordered in ED Medications  potassium chloride SA (KLOR-CON) CR tablet 40 mEq (has no administration in time range)  acetaminophen (TYLENOL) tablet 650 mg (has no administration in time range)  cefTRIAXone (ROCEPHIN) 1 g in sodium chloride 0.9 % 100 mL IVPB (has no administration in time range)  sodium chloride 0.9 % bolus 1,000 mL (0 mLs Intravenous Stopped 05/23/19 2317)     Initial Impression / Assessment and Plan / ED Course  I have reviewed the triage vital signs and the nursing notes.  Pertinent labs & imaging results that were available during my care of the patient were reviewed by me and considered in my medical decision making (see chart for details).       DAYANNA PRYCE is a [id number] 60+7 who presents today for concern of right sided pyelonephritis. 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    On exam she is generally well appearing.  She reports that she has been struggling significantly with nausea and vomiting during this pregnancy.  Abs are obtained and reviewed potassium is slightly low at 3.2.  Oral repletion is ordered.  Hemoglobin is slightly low at 11.3.  She is not have a significant leukocytosis and lipase is not elevated.  hCG quant is 113,000.  She is afebrile here.  UA concerning for infection with many bacteria, over 50 WBC, and 21-50 RBC.  It is contaminated with squamous cells at 11-20.  Urine sent for culture.  She has diffuse paraspinal back pain with right sided CVA TTpalpation.    She has not had any imaging this pregnancy.   US renal was ordered along with OB US.    She was given IV antibiotics and fluids while in the ER.    At shift change care was transferred to [facility] who will follow pending studies, re-evaulate and determine disposition.     Final Clinical Impressions(s) / ED Diagnoses   Final diagnoses:  Urinary tract infection in mother during second trimester of pregnancy    ED Discharge Orders    None       Lorin Glass, [place] 05/24/19 0023

## 2019-05-24 ENCOUNTER — Encounter: Payer: Self-pay | Admitting: Obstetrics and Gynecology

## 2019-05-24 ENCOUNTER — Other Ambulatory Visit: Payer: Self-pay | Admitting: Obstetrics and Gynecology

## 2019-05-24 MED ORDER — SODIUM CHLORIDE 0.9 % IV BOLUS
1000.0000 mL | Freq: Once | INTRAVENOUS | Status: AC
  Administered 2019-05-24: 1000 mL via INTRAVENOUS

## 2019-05-24 MED ORDER — ACETAMINOPHEN 325 MG PO TABS
650.0000 mg | ORAL_TABLET | Freq: Once | ORAL | Status: AC
  Administered 2019-05-24: 650 mg via ORAL
  Filled 2019-05-24: qty 2

## 2019-05-24 MED ORDER — SODIUM CHLORIDE 0.9 % IV SOLN
1.0000 g | Freq: Once | INTRAVENOUS | Status: AC
  Administered 2019-05-24: 01:00:00 1 g via INTRAVENOUS
  Filled 2019-05-24: qty 10

## 2019-05-24 MED ORDER — POTASSIUM CHLORIDE CRYS ER 20 MEQ PO TBCR
40.0000 meq | EXTENDED_RELEASE_TABLET | Freq: Once | ORAL | Status: AC
  Administered 2019-05-24: 40 meq via ORAL
  Filled 2019-05-24: qty 2

## 2019-05-24 MED ORDER — CEPHALEXIN 500 MG PO CAPS: 500.0000 mg | ORAL_CAPSULE | Freq: Four times a day (QID) | ORAL | 2 refills | Status: DC

## 2019-05-24 NOTE — ED Provider Notes (Addendum)
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 Pyelo in pregnancy  Renal US shows a severe right hydronephrosis, likely chronic as it appears on previous imaging.   On recheck, the patient is feeling better. BP now 90/56. A second liter bolus ordered.   OB US shows a 9w IUP without complication.   Discussed with OB, Dr. Rosana Hoes, who recommends admission to [facility] for treatment of infection. Patient updated on plan of admission. Will transfer as a direct admit.     Charlann Lange, PA-C 05/24/19 8638  2:30 - The patient now refuses admission and is willing to be discharged AMA. Discussed the possibility of worsening illness, compromise of the pregnancy. She is adamant she wants to go home.   Dr. Rosana Hoes made aware. Will inbox the patient's information to her at her request to insure follow up care.    Charlann Lange, PA-C 05/24/19 0235    Valarie Merino, MD 05/26/19 [phone]

## 2019-05-24 NOTE — ED Notes (Signed)
PT advised by provider to be admitted and [facility] but pt and significant other do not want to stay. Pt made aware that she would be signing out again medical advice. Advised pt to at least stay long enough for additional fluids and pt has agreed to do so. Provider made aware of pts decision to leave AMA.

## 2019-05-24 NOTE — Discharge Instructions (Addendum)
Please follow up with OB as soon as possible for recheck of infection and further pregnancy care.

## 2019-05-24 NOTE — Progress Notes (Signed)
Received call overnight from [facility] ED regarding patient who is [redacted] weeks pregnant with suspected pyelonephritis. Recommended admission to [facility] given pyelonephritis in pregnancy and duplication of renal collecting system. Received 2nd call that patient has opted to leave AMA rather than be admitted. Message sent to [facility] (where she has received GYN care) to follow up with patient. Antibiotics sent to pharmacy, called patient to inform her no answer, could not leave VM. Will have office contact her for follow up.   Feliz Beam, M.D. Attending Center for [employer] farm manager)

## 2019-05-24 NOTE — Progress Notes (Signed)
Keflex sent to pharmacy

## 2019-05-26 ENCOUNTER — Telehealth: Payer: Self-pay | Admitting: *Deleted

## 2019-05-26 LAB — URINE CULTURE: Culture: >=100000 — AB

## 2019-05-26 NOTE — Telephone Encounter (Signed)
-----   Message from Sloan Leiter, MD sent at 05/24/2019  7:46 AM EST ----- Pt seen at [facility] ED for pyelonephritis, [redacted] weeks pregnant, recommended admission but she left AMA. Please call and have her come in ASAP for follow up, she is known [facility] patient, unknown if she is planning to f/u with [facility] for OB care.

## 2019-05-26 NOTE — Telephone Encounter (Signed)
Patient returned call, she has plans to return to [facility] today as soon as her husband gets off work.    She did pick up and start her Keflex Rx.  She does state she has some kidney swelling.  I stressed the importance of her going to [facility] for IV antibiotics.    She states she does not think she will be able to carry this pregnancy.   Patient was instructed to go directly to [facility] and to call us back to set up [facility] if she decides to keep her pregnancy.   Patient states that [phone] is her correct number and states it is working now.

## 2019-05-26 NOTE — Telephone Encounter (Signed)
Telephone call to patient to schedule follow up.  Her phone is not in service.  I called her brother, emergency contact to leave a message.  He is able to reach her by [employer] and will relay the message for her to call.

## 2019-05-27 ENCOUNTER — Telehealth: Payer: Self-pay | Admitting: Emergency Medicine

## 2019-05-27 NOTE — Progress Notes (Signed)
ED Antimicrobial Stewardship Positive Culture Follow Up   Alisha Wall is an 28 y.o. female who is [redacted] wk pregnant presented to [facility] on 05/23/2019 with a chief complaint of dysuria and right flank pain.  Chief Complaint  Patient presents with  . Flank Pain  . Dysuria    Recent Results (from the past 720 hour(s))  Urine culture     Status: Abnormal   Collection Time: 05/23/19  8:51 PM   Specimen: Urine, Random  Result Value Ref Range Status   Specimen Description   Final    URINE, RANDOM Performed at [facility] [address], [place], [place] [address]    Special Requests   Final    NONE Performed at [facility], [place] [address] [place], [place] [address]    Culture >=100,000 COLONIES/mL ESCHERICHIA COLI (A)  Final   Report Status 05/26/2019 FINAL  Final   Organism ID, Bacteria ESCHERICHIA COLI (A)  Final      Susceptibility   Escherichia coli - MIC*    AMPICILLIN <=2 SENSITIVE Sensitive     CEFAZOLIN <=4 SENSITIVE Sensitive     CEFTRIAXONE <=1 SENSITIVE Sensitive     CIPROFLOXACIN <=0.25 SENSITIVE Sensitive     GENTAMICIN <=1 SENSITIVE Sensitive     IMIPENEM <=0.25 SENSITIVE Sensitive     NITROFURANTOIN <=16 SENSITIVE Sensitive     TRIMETH/SULFA <=20 SENSITIVE Sensitive     AMPICILLIN/SULBACTAM <=2 SENSITIVE Sensitive     PIP/TAZO <=4 SENSITIVE Sensitive     Extended ESBL NEGATIVE Sensitive     * >=100,000 COLONIES/mL ESCHERICHIA COLI    Per note from Mellody Dance from telephone encounter on 05/26/2019, patient received a prescription for keflex.  Keflex will cover for Ecoli ucx since suscept. shows that  is sensitive to cefazolin.  Lynelle Doctor 05/27/2019, 9:36 AM Clinical Pharmacist [phone]

## 2019-05-27 NOTE — Telephone Encounter (Signed)
Post ED Visit - Positive Culture Follow-up  Culture report reviewed by antimicrobial stewardship pharmacist: [place] Team []  Elenor Quinones, Pharm.D. []  Heide Guile, Pharm.D., BCPS AQ-ID []  Parks Neptune, Pharm.D., BCPS []  Alycia Rossetti, Pharm.D., BCPS []  [place], Pharm.D., BCPS, AAHIVP []  Legrand Como, Pharm.D., BCPS, AAHIVP []  Salome Arnt, PharmD, BCPS []  Johnnette Gourd, PharmD, BCPS []  Hughes Better, PharmD, BCPS []  Leeroy Cha, PharmD []  Laqueta Linden, PharmD, BCPS []  Albertina Parr, PharmD  [place] Team []  Leodis Sias, PharmD []  Lindell Spar, PharmD []  Royetta Asal, PharmD []  Graylin Shiver, Rph []  Rema Fendt) Glennon Mac, PharmD []  Arlyn Dunning, PharmD []  Netta Cedars, PharmD [x]  Dia Sitter, PharmD []  Leone Haven, PharmD []  Gretta Arab, PharmD []  Theodis Shove, PharmD []  Peggyann Juba, PharmD []  Reuel Boom, PharmD   Positive urine culture Treated with keflex, organism sensitive to the same and no further patient follow-up is required at this time.  Hazle Nordmann 05/27/2019, 10:54 AM

## 2019-05-28 ENCOUNTER — Encounter

## 2019-05-28 ENCOUNTER — Ambulatory Visit: Payer: Medicaid Other | Admitting: Gastroenterology

## 2019-06-06 ENCOUNTER — Inpatient Hospital Stay (HOSPITAL_COMMUNITY)
Admission: AD | Admit: 2019-06-06 | Discharge: 2019-06-09 | DRG: 833 | Disposition: A | Discharge status: Home / Self Care | Payer: Medicaid Other | Attending: Obstetrics and Gynecology | Admitting: Obstetrics and Gynecology

## 2019-06-06 ENCOUNTER — Other Ambulatory Visit: Payer: Self-pay

## 2019-06-06 ENCOUNTER — Encounter (HOSPITAL_COMMUNITY): Payer: Self-pay

## 2019-06-06 DIAGNOSIS — O26891 Other specified pregnancy related conditions, first trimester: Secondary | ICD-10-CM

## 2019-06-06 DIAGNOSIS — O2301 Infections of kidney in pregnancy, first trimester: Principal | ICD-10-CM | POA: Diagnosis present

## 2019-06-06 DIAGNOSIS — O210 Mild hyperemesis gravidarum: Secondary | ICD-10-CM | POA: Diagnosis present

## 2019-06-06 DIAGNOSIS — R109 Unspecified abdominal pain: Secondary | ICD-10-CM

## 2019-06-06 DIAGNOSIS — Z20828 Contact with and (suspected) exposure to other viral communicable diseases: Secondary | ICD-10-CM | POA: Diagnosis present

## 2019-06-06 DIAGNOSIS — Q625 Duplication of ureter: Secondary | ICD-10-CM

## 2019-06-06 DIAGNOSIS — Z3A11 11 weeks gestation of pregnancy: Secondary | ICD-10-CM

## 2019-06-06 HISTORY — DX: Tubulo-interstitial nephritis, not specified as acute or chronic: N12

## 2019-06-06 LAB — CBC WITH DIFFERENTIAL/PLATELET
Abs Immature Granulocytes: 0.01 10*3/uL (ref 0.00–0.07)
Basophils Absolute: 0 10*3/uL (ref 0.0–0.1)
Basophils Relative: 0 %
Eosinophils Absolute: 0.1 10*3/uL (ref 0.0–0.5)
Eosinophils Relative: 1 %
HCT: 33.3 % — ABNORMAL LOW (ref 36.0–46.0)
Hemoglobin: 11.5 g/dL — ABNORMAL LOW (ref 12.0–15.0)
Immature Granulocytes: 0 %
Lymphocytes Relative: 27 %
Lymphs Abs: 2.2 10*3/uL (ref 0.7–4.0)
MCH: 29.4 pg (ref 26.0–34.0)
MCHC: 34.5 g/dL (ref 30.0–36.0)
MCV: 85.2 fL (ref 80.0–100.0)
Monocytes Absolute: 0.9 10*3/uL (ref 0.1–1.0)
Monocytes Relative: 11 %
Neutro Abs: 4.9 10*3/uL (ref 1.7–7.7)
Neutrophils Relative %: 61 %
Platelets: 353 10*3/uL (ref 150–400)
RBC: 3.91 MIL/uL (ref 3.87–5.11)
RDW: 13.2 % (ref 11.5–15.5)
WBC: 8.1 10*3/uL (ref 4.0–10.5)
nRBC: 0 % (ref 0.0–0.2)

## 2019-06-06 LAB — URINALYSIS, ROUTINE W REFLEX MICROSCOPIC
Bilirubin Urine: NEGATIVE
Glucose, UA: NEGATIVE mg/dL
Ketones, ur: NEGATIVE mg/dL
Nitrite: POSITIVE — AB
Protein, ur: 100 mg/dL — AB
Specific Gravity, Urine: 1.023 (ref 1.005–1.030)
WBC, UA: >50 WBC/hpf — ABNORMAL HIGH (ref 0–5)
pH: 6 (ref 5.0–8.0)

## 2019-06-06 LAB — SARS CORONAVIRUS 2 (TAT 6-24 HRS): SARS Coronavirus 2: NEGATIVE

## 2019-06-06 MED ORDER — SODIUM CHLORIDE 0.9 % IV SOLN
2.0000 g | INTRAVENOUS | Status: DC
  Administered 2019-06-06 – 2019-06-09 (×4): 2 g via INTRAVENOUS
  Filled 2019-06-06 (×2): qty 20
  Filled 2019-06-06 (×2): qty 2

## 2019-06-06 MED ORDER — PRENATAL MULTIVITAMIN CH
1.0000 | ORAL_TABLET | Freq: Every day | ORAL | Status: DC
  Administered 2019-06-08: 1 via ORAL
  Filled 2019-06-06: qty 1

## 2019-06-06 MED ORDER — OXYCODONE-ACETAMINOPHEN 5-325 MG PO TABS
1.0000 | ORAL_TABLET | Freq: Four times a day (QID) | ORAL | Status: DC | PRN
  Administered 2019-06-06 – 2019-06-08 (×6): 2 via ORAL
  Filled 2019-06-06 (×8): qty 2

## 2019-06-06 MED ORDER — DOCUSATE SODIUM 100 MG PO CAPS
100.0000 mg | ORAL_CAPSULE | Freq: Every day | ORAL | Status: DC
  Administered 2019-06-08: 100 mg via ORAL
  Filled 2019-06-06: qty 1

## 2019-06-06 MED ORDER — ACETAMINOPHEN 325 MG PO TABS
650.0000 mg | ORAL_TABLET | ORAL | Status: DC | PRN
  Administered 2019-06-06 – 2019-06-08 (×2): 650 mg via ORAL
  Filled 2019-06-06 (×2): qty 2

## 2019-06-06 MED ORDER — CALCIUM CARBONATE ANTACID 500 MG PO CHEW: 2.0000 | CHEWABLE_TABLET | ORAL | Status: DC | PRN

## 2019-06-06 MED ORDER — SODIUM CHLORIDE 0.9 % IV SOLN: INTRAVENOUS | Status: DC

## 2019-06-06 MED ORDER — PROMETHAZINE HCL 25 MG/ML IJ SOLN
12.5000 mg | INTRAMUSCULAR | Status: DC | PRN
  Administered 2019-06-06 – 2019-06-07 (×3): 12.5 mg via INTRAVENOUS
  Filled 2019-06-06 (×3): qty 1

## 2019-06-06 MED ORDER — ZOLPIDEM TARTRATE 5 MG PO TABS: 5.0000 mg | ORAL_TABLET | Freq: Every evening | ORAL | Status: DC | PRN

## 2019-06-06 NOTE — MAU Provider Note (Signed)
Chief Complaint: Urinary Tract Infection, Abdominal Pain, and Back Pain   First Provider Initiated Contact with Patient 06/06/19 0101        SUBJECTIVE HPI: Alisha Wall is a 28 y.o. [id number] at [redacted]w[redacted]d by LMP who presents to maternity admissions reporting worsening pain in bilateral flanks.  Was seen in [facility] on 05/24/2019 for pyelonephritis.  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 Was advised to be admitted for IV antibiotics, but left AMA due to child care.  Returns today stating has been vomiting all week and not sure if Keflex stayed down.  She denies vaginal bleeding, vaginal itching/burning, h/a, dizziness, or fever/chills.     Urinary Tract Infection  This is a recurrent problem. The current episode started in the past 7 days. The problem occurs every urination. The problem has been unchanged. The quality of the pain is described as aching. The pain is moderate. There has been no fever. There is a history of pyelonephritis. Associated symptoms include flank pain, nausea, a possible pregnancy and vomiting. Pertinent negatives include no chills. She has tried antibiotics for the symptoms. The treatment provided no relief.  Abdominal Pain This is a recurrent problem. The current episode started in the past 7 days. The problem occurs constantly. Associated symptoms include nausea and vomiting. Pertinent negatives include no constipation, diarrhea or fever. Nothing aggravates the pain. The pain is relieved by nothing. She has tried antibiotics for the symptoms. The treatment provided no relief.   RN note: Hx utis. 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 Seen at [facility] 12/4 and dx with kidney infection. They wanted to admit me but did not have anyone to keep my boys. Taking Keflex currently.  Having a lot of back pain and abdominal pain. Having a lot of n/v. Vomit 3-4 times a day. Denies vag bleeding or d/c  Past Medical History:  Diagnosis Date  .  Anxiety   . Depression   . Pyelonephritis   . UTI (lower urinary tract infection)    Past Surgical History:  Procedure Laterality Date  . INDUCED ABORTION     Social History   Socioeconomic History  . Marital status: Single    Spouse name: Not on file  . Number of children: Not on file  . Years of education: Not on file  . Highest education level: Not on file  Occupational History  . Not on file  Tobacco Use  . Smoking status: Never Smoker  . Smokeless tobacco: Never Used  Substance and Sexual Activity  . Alcohol use: Not Currently    Comment: occ wine (rare)   . Drug use: No  . Sexual activity: Yes    Partners: Male    Birth control/protection: None  Other Topics Concern  . Not on file  Social History Narrative  . Not on file   Social Determinants of Health   Financial Resource Strain:   . Difficulty of Paying Living Expenses: Not on file  Food Insecurity:   . Worried About Programme researcher, broadcasting/film/video in the Last Year: Not on file  . Ran Out of Food in the Last Year: Not on file  Transportation Needs:   . Lack of Transportation (Medical): Not on file  . Lack of Transportation (Non-Medical): Not on file  Physical Activity:   . Days of Exercise per Week: Not on file  . Minutes of Exercise per Session: Not on file  Stress:   . Feeling of Stress : Not  on file  Social Connections:   . Frequency of Communication with Friends and Family: Not on file  . Frequency of Social Gatherings with Friends and Family: Not on file  . Attends Religious Services: Not on file  . Active Member of Clubs or Organizations: Not on file  . Attends BankerClub or Organization Meetings: Not on file  . Marital Status: Not on file  Intimate Partner Violence:   . Fear of Current or Ex-Partner: Not on file  . Emotionally Abused: Not on file  . Physically Abused: Not on file  . Sexually Abused: Not on file   No current facility-administered medications on file prior to encounter.   Current Outpatient  Medications on File Prior to Encounter  Medication Sig Dispense Refill  . acetaminophen (TYLENOL) 325 MG tablet Take 325 mg by mouth every 6 (six) hours as needed.    . cephALEXin (KEFLEX) 500 MG capsule Take 1 capsule (500 mg total) by mouth 3 (three) times daily. 30 capsule 0  . cephALEXin (KEFLEX) 500 MG capsule Take 1 capsule (500 mg total) by mouth 4 (four) times daily. 28 capsule 2  . Prenat-FeAsp-Meth-FA-DHA w/o A (PRENATE PIXIE) 10-0.6-0.4-200 MG CAPS Take 1 capsule by mouth daily before breakfast. 30 capsule 11  . hydrocortisone (CORTEF) 5 MG tablet Take 1 tablet (5 mg total) by mouth 2 (two) times daily. (Patient not taking: Reported on 10/07/2018) 60 tablet 0  . metroNIDAZOLE (FLAGYL) 500 MG tablet Take 1 tablet (500 mg total) by mouth 2 (two) times daily. One po bid x 7 days (Patient not taking: Reported on 12/18/2018) 14 tablet 0  . nitrofurantoin, macrocrystal-monohydrate, (MACROBID) 100 MG capsule Take 1 capsule (100 mg total) by mouth 2 (two) times daily. (Patient not taking: Reported on 12/18/2018) 14 capsule 2   No Known Allergies  I have reviewed patient's Past Medical Hx, Surgical Hx, Family Hx, Social Hx, medications and allergies.   ROS:  Review of Systems  Constitutional: Negative for chills and fever.  Respiratory: Negative for shortness of breath.   Gastrointestinal: Positive for nausea and vomiting. Negative for constipation and diarrhea.  Genitourinary: Positive for flank pain. Negative for vaginal bleeding.  Musculoskeletal: Positive for back pain.   Review of Systems  Other systems negative   Physical Exam  Physical Exam Patient Vitals for the past 24 hrs:  BP Temp Pulse Resp SpO2 Height Weight  06/06/19 0028 107/68 98.5 F (36.9 C) 79 18 99 % 5' 5.5" (1.664 m) 69.4 kg   Constitutional: Well-developed, well-nourished female in no acute distress.  Cardiovascular: normal rate Respiratory: normal effort GI: Abd soft, non-tender. Pos BS x 4 MS: Extremities  nontender, no edema, normal ROM Neurologic: Alert and oriented x 4.  GU: Bilateral CVAT.  PELVIC EXAM: deferred  FHR 159  LAB RESULTS Results for orders placed or performed during the hospital encounter of 06/06/19 (from the past 24 hour(s))  Urinalysis, Routine w reflex microscopic     Status: Abnormal   Collection Time: 06/06/19  1:14 AM  Result Value Ref Range   Color, Urine YELLOW YELLOW   APPearance TURBID (A) CLEAR   Specific Gravity, Urine 1.023 1.005 - 1.030   pH 6.0 5.0 - 8.0   Glucose, UA NEGATIVE NEGATIVE mg/dL   Hgb urine dipstick MODERATE (A) NEGATIVE   Bilirubin Urine NEGATIVE NEGATIVE   Ketones, ur NEGATIVE NEGATIVE mg/dL   Protein, ur 469100 (A) NEGATIVE mg/dL   Nitrite POSITIVE (A) NEGATIVE   Leukocytes,Ua LARGE (A) NEGATIVE  RBC / HPF 0-5 0 - 5 RBC/hpf   WBC, UA >50 (H) 0 - 5 WBC/hpf   Bacteria, UA MANY (A) NONE SEEN   Budding Yeast PRESENT   CBC with Differential     Status: Abnormal   Collection Time: 06/06/19  1:14 AM  Result Value Ref Range   WBC 8.1 4.0 - 10.5 K/uL   RBC 3.91 3.87 - 5.11 MIL/uL   Hemoglobin 11.5 (L) 12.0 - 15.0 g/dL   HCT 33.3 (L) 36.0 - 46.0 %   MCV 85.2 80.0 - 100.0 fL   MCH 29.4 26.0 - 34.0 pg   MCHC 34.5 30.0 - 36.0 g/dL   RDW 13.2 11.5 - 15.5 %   Platelets 353 150 - 400 K/uL   nRBC 0.0 0.0 - 0.2 %   Neutrophils Relative % 61 %   Neutro Abs 4.9 1.7 - 7.7 K/uL   Lymphocytes Relative 27 %   Lymphs Abs 2.2 0.7 - 4.0 K/uL   Monocytes Relative 11 %   Monocytes Absolute 0.9 0.1 - 1.0 K/uL   Eosinophils Relative 1 %   Eosinophils Absolute 0.1 0.0 - 0.5 K/uL   Basophils Relative 0 %   Basophils Absolute 0.0 0.0 - 0.1 K/uL   Immature Granulocytes 0 %   Abs Immature Granulocytes 0.01 0.00 - 0.07 K/uL       IMAGING Done last week: US OB Comp < 14 Wks  Result Date: 05/24/2019 CLINICAL DATA:  Initial evaluation for acute abdominal and flank pain, early pregnancy. EXAM: OBSTETRIC <14 WK ULTRASOUND TECHNIQUE: Transabdominal  ultrasound was performed for evaluation of the gestation as well as the maternal uterus and adnexal regions. COMPARISON:  None. FINDINGS: Intrauterine gestational sac: Single Yolk sac:  Present Embryo:  Present Cardiac Activity: Present Heart Rate: 176 bpm CRL: 27.3 mm   9 w 4 d                  [place] EDC: 12/22/2019 Subchorionic hemorrhage:  None visualized. Maternal uterus/adnexae: Ovaries are normal in appearance bilaterally. No adnexal mass or free fluid. Approximate 2 cm intramural fibroid noted at the lower uterine segment. IMPRESSION: 1. Single viable intrauterine pregnancy as above, estimated gestational age [redacted] weeks and 4 days by crown-rump length, with ultrasound EDC of 12/22/2019. No complication. 2. No other acute maternal uterine or adnexal abnormality identified. 3. 2 cm fibroid at the lower uterine segment. Electronically Signed   By: Jeannine Boga M.D.   On: 05/24/2019 00:22   US Renal  Result Date: 05/24/2019 CLINICAL DATA:  Initial evaluation for acute abdominal and flank pain. EXAM: RENAL / URINARY TRACT ULTRASOUND COMPLETE COMPARISON:  Prior CT from 12/18/2018. FINDINGS: Right Kidney: Renal measurements: 15.3 x 5.9 x 7.3 cm = volume: 341.5 mL. Echogenicity within normal limits. No nephrolithiasis or focal renal mass. 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 Overlying diffuse cortical thinning, suggesting that this is chronic in nature. Appearance is similar to previous CT. Left Kidney: Renal measurements: 12.9 x 5.3 x 4.6 cm = volume: 162.8 mL. Echogenicity within normal limits. No visible nephrolithiasis or focal renal mass. Probable duplicated left renal collecting system. No hydronephrosis. Bladder: Appears normal for degree of bladder distention. Two ureteral jets are seen bilaterally. Other: None. IMPRESSION: 1. 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 Appearance is grossly stable  from prior CT, and likely represents a chronic finding. 2. 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 Electronically Signed   By: Rise Mu M.D.   On: 05/24/2019 00:31    MAU Management/MDM: Consult Dr Jolayne Panther with presentation, exam findings, and results.  She recommends admission for IV antibiotics and hydration.  Will reassess tomorrow for possible re-imaging and Urology consult  ASSESSMENT Single intrauterine pregnancy at [redacted]w[redacted]d Bilateral duplicated collecting systems Pyelonephritis Nausea and vomiting  PLAN Admit to [facility] Specialty Care Routine orders IV Rocephin 2gm QD MD to follow   Wynelle Bourgeois CNM, MSN Certified Nurse-Midwife 06/06/2019  1:02 AM

## 2019-06-06 NOTE — H&P (Signed)
HPI: Alisha Wall is a 28 y.o. [id number] at [redacted]w[redacted]d by LMP who presents to maternity admissions reporting worsening pain in bilateral flanks.  Was seen in [facility] on 05/24/2019 for pyelonephritis.  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 Was advised to be admitted for IV antibiotics, but left AMA due to child care.  Returns today stating has been vomiting all week and not sure if Keflex stayed down.  She denies vaginal bleeding, vaginal itching/burning, h/a, dizziness, or fever/chills.      Past Medical History:  Diagnosis Date  . Anxiety   . Depression   . Pyelonephritis   . UTI (lower urinary tract infection)    Past Surgical History:  Procedure Laterality Date  . INDUCED ABORTION     Social History   Socioeconomic History  . Marital status: Single    Spouse name: Not on file  . Number of children: Not on file  . Years of education: Not on file  . Highest education level: Not on file  Occupational History  . Not on file  Tobacco Use  . Smoking status: Never Smoker  . Smokeless tobacco: Never Used  Substance and Sexual Activity  . Alcohol use: Not Currently    Comment: occ wine (rare)   . Drug use: No  . Sexual activity: Yes    Partners: Male    Birth control/protection: None  Other Topics Concern  . Not on file  Social History Narrative  . Not on file   Social Determinants of Health   Financial Resource Strain:   . Difficulty of Paying Living Expenses: Not on file  Food Insecurity:   . Worried About Charity fundraiser in the Last Year: Not on file  . Ran Out of Food in the Last Year: Not on file  Transportation Needs:   . Lack of Transportation (Medical): Not on file  . Lack of Transportation (Non-Medical): Not on file  Physical Activity:   . Days of Exercise per Week: Not on file  . Minutes of Exercise per Session: Not on file  Stress:   . Feeling of Stress : Not on file  Social Connections:   .  Frequency of Communication with Friends and Family: Not on file  . Frequency of Social Gatherings with Friends and Family: Not on file  . Attends Religious Services: Not on file  . Active Member of Clubs or Organizations: Not on file  . Attends Archivist Meetings: Not on file  . Marital Status: Not on file  Intimate Partner Violence:   . Fear of Current or Ex-Partner: Not on file  . Emotionally Abused: Not on file  . Physically Abused: Not on file  . Sexually Abused: Not on file   No current facility-administered medications on file prior to encounter.   Current Outpatient Medications on File Prior to Encounter  Medication Sig Dispense Refill  . acetaminophen (TYLENOL) 325 MG tablet Take 325 mg by mouth every 6 (six) hours as needed.    . cephALEXin (KEFLEX) 500 MG capsule Take 1 capsule (500 mg total) by mouth 3 (three) times daily. 30 capsule 0  . cephALEXin (KEFLEX) 500 MG capsule Take 1 capsule (500 mg total) by mouth 4 (four) times daily. 28 capsule 2  . Prenat-FeAsp-Meth-FA-DHA w/o A (PRENATE PIXIE) 10-0.6-0.4-200 MG CAPS Take 1 capsule by mouth daily before breakfast. 30 capsule 11  . hydrocortisone (CORTEF) 5 MG tablet Take 1 tablet (5 mg total)  by mouth 2 (two) times daily. (Patient not taking: Reported on 10/07/2018) 60 tablet 0  . metroNIDAZOLE (FLAGYL) 500 MG tablet Take 1 tablet (500 mg total) by mouth 2 (two) times daily. One po bid x 7 days (Patient not taking: Reported on 12/18/2018) 14 tablet 0  . nitrofurantoin, macrocrystal-monohydrate, (MACROBID) 100 MG capsule Take 1 capsule (100 mg total) by mouth 2 (two) times daily. (Patient not taking: Reported on 12/18/2018) 14 capsule 2   No Known Allergies  I have reviewed patient's Past Medical Hx, Surgical Hx, Family Hx, Social Hx, medications and allergies.   ROS:  Review of Systems  Constitutional: Negative for chills and fever.  Respiratory: Negative for shortness of breath.   Gastrointestinal: Positive for  nausea and vomiting. Negative for constipation and diarrhea.  Genitourinary: Positive for flank pain. Negative for vaginal bleeding.  Musculoskeletal: Positive for back pain.   Review of Systems  Other systems negative   Physical Exam  Physical Exam Patient Vitals for the past 24 hrs:  BP Temp Pulse Resp SpO2 Height Weight  06/06/19 0028 107/68 98.5 F (36.9 C) 79 18 99 % 5' 5.5" (1.664 m) 69.4 kg   Constitutional: Well-developed, well-nourished female in no acute distress.  Cardiovascular: normal rate Respiratory: normal effort GI: Abd soft, non-tender. Pos BS x 4 MS: Extremities nontender, no edema, normal ROM Neurologic: Alert and oriented x 4.  GU: Bilateral CVAT.  PELVIC EXAM: deferred  FHR 159  LAB RESULTS Results for orders placed or performed during the hospital encounter of 06/06/19 (from the past 24 hour(s))  Urinalysis, Routine w reflex microscopic     Status: Abnormal   Collection Time: 06/06/19  1:14 AM  Result Value Ref Range   Color, Urine YELLOW YELLOW   APPearance TURBID (A) CLEAR   Specific Gravity, Urine 1.023 1.005 - 1.030   pH 6.0 5.0 - 8.0   Glucose, UA NEGATIVE NEGATIVE mg/dL   Hgb urine dipstick MODERATE (A) NEGATIVE   Bilirubin Urine NEGATIVE NEGATIVE   Ketones, ur NEGATIVE NEGATIVE mg/dL   Protein, ur 161100 (A) NEGATIVE mg/dL   Nitrite POSITIVE (A) NEGATIVE   Leukocytes,Ua LARGE (A) NEGATIVE   RBC / HPF 0-5 0 - 5 RBC/hpf   WBC, UA >50 (H) 0 - 5 WBC/hpf   Bacteria, UA MANY (A) NONE SEEN   Budding Yeast PRESENT   CBC with Differential     Status: Abnormal   Collection Time: 06/06/19  1:14 AM  Result Value Ref Range   WBC 8.1 4.0 - 10.5 K/uL   RBC 3.91 3.87 - 5.11 MIL/uL   Hemoglobin 11.5 (L) 12.0 - 15.0 g/dL   HCT 09.633.3 (L) 04.536.0 - 40.946.0 %   MCV 85.2 80.0 - 100.0 fL   MCH 29.4 26.0 - 34.0 pg   MCHC 34.5 30.0 - 36.0 g/dL   RDW 81.113.2 91.411.5 - 78.215.5 %   Platelets 353 150 - 400 K/uL   nRBC 0.0 0.0 - 0.2 %   Neutrophils Relative % 61 %   Neutro  Abs 4.9 1.7 - 7.7 K/uL   Lymphocytes Relative 27 %   Lymphs Abs 2.2 0.7 - 4.0 K/uL   Monocytes Relative 11 %   Monocytes Absolute 0.9 0.1 - 1.0 K/uL   Eosinophils Relative 1 %   Eosinophils Absolute 0.1 0.0 - 0.5 K/uL   Basophils Relative 0 %   Basophils Absolute 0.0 0.0 - 0.1 K/uL   Immature Granulocytes 0 %   Abs Immature Granulocytes 0.01 0.00 -  0.07 K/uL       IMAGING Done last week: US OB Comp < 14 Wks  Result Date: 05/24/2019 CLINICAL DATA:  Initial evaluation for acute abdominal and flank pain, early pregnancy. EXAM: OBSTETRIC <14 WK ULTRASOUND TECHNIQUE: Transabdominal ultrasound was performed for evaluation of the gestation as well as the maternal uterus and adnexal regions. COMPARISON:  None. FINDINGS: Intrauterine gestational sac: Single Yolk sac:  Present Embryo:  Present Cardiac Activity: Present Heart Rate: 176 bpm CRL: 27.3 mm   9 w 4 d                  [place] EDC: 12/22/2019 Subchorionic hemorrhage:  None visualized. Maternal uterus/adnexae: Ovaries are normal in appearance bilaterally. No adnexal mass or free fluid. Approximate 2 cm intramural fibroid noted at the lower uterine segment. IMPRESSION: 1. Single viable intrauterine pregnancy as above, estimated gestational age [redacted] weeks and 4 days by crown-rump length, with ultrasound EDC of 12/22/2019. No complication. 2. No other acute maternal uterine or adnexal abnormality identified. 3. 2 cm fibroid at the lower uterine segment. Electronically Signed   By: Rise Mu M.D.   On: 05/24/2019 00:22   US Renal  Result Date: 05/24/2019 CLINICAL DATA:  Initial evaluation for acute abdominal and flank pain. EXAM: RENAL / URINARY TRACT ULTRASOUND COMPLETE COMPARISON:  Prior CT from 12/18/2018. FINDINGS: Right Kidney: Renal measurements: 15.3 x 5.9 x 7.3 cm = volume: 341.5 mL. Echogenicity within normal limits. No nephrolithiasis or focal renal mass. Duplication of the right renal collecting system again seen with severe  hydronephrosis of the upper pole moiety. Overlying diffuse cortical thinning, suggesting that this is chronic in nature. Appearance is similar to previous CT. Left Kidney: Renal measurements: 12.9 x 5.3 x 4.6 cm = volume: 162.8 mL. Echogenicity within normal limits. No visible nephrolithiasis or focal renal mass. Probable duplicated left renal collecting system. No hydronephrosis. Bladder: Appears normal for degree of bladder distention. Two ureteral jets are seen bilaterally. Other: None. IMPRESSION: 1. 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 Appearance is grossly stable from prior CT, and likely represents a chronic finding. 2. 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 Electronically Signed   By: Rise Mu M.D.   On: 05/24/2019 00:31    ASSESSMENT Single intrauterine pregnancy at [redacted]w[redacted]d Bilateral duplicated collecting systems Pyelonephritis Nausea and vomiting  PLAN Admit to [facility] Specialty Care Routine orders IV Rocephin 2gm QD Pain management prn

## 2019-06-06 NOTE — MAU Note (Addendum)
Hx utis. 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 Seen at [facility] 12/4 and dx with kidney infection. They wanted to admit me but did not have anyone to keep my boys. Taking Keflex currently.  Having a lot of back pain and abdominal pain. Having a lot of n/v. Vomit 3-4 times a day. Denies vag bleeding or d/c

## 2019-06-07 DIAGNOSIS — O2301 Infections of kidney in pregnancy, first trimester: Secondary | ICD-10-CM | POA: Diagnosis present

## 2019-06-07 DIAGNOSIS — R109 Unspecified abdominal pain: Secondary | ICD-10-CM | POA: Diagnosis present

## 2019-06-07 DIAGNOSIS — Z3A11 11 weeks gestation of pregnancy: Secondary | ICD-10-CM | POA: Diagnosis not present

## 2019-06-07 DIAGNOSIS — O210 Mild hyperemesis gravidarum: Secondary | ICD-10-CM | POA: Diagnosis present

## 2019-06-07 DIAGNOSIS — O26891 Other specified pregnancy related conditions, first trimester: Secondary | ICD-10-CM | POA: Diagnosis not present

## 2019-06-07 DIAGNOSIS — Q625 Duplication of ureter: Secondary | ICD-10-CM | POA: Diagnosis not present

## 2019-06-07 DIAGNOSIS — Z20828 Contact with and (suspected) exposure to other viral communicable diseases: Secondary | ICD-10-CM | POA: Diagnosis present

## 2019-06-07 NOTE — Progress Notes (Signed)
Patient ID: Alisha Wall, female   DOB: January 26, 1991, 28 y.o.   MRN: [id number] [place]) NOTE  SHENEIKA WALSTAD is a 28 y.o. [id number] with Estimated Date of Delivery: 12/22/19   By   [redacted]w[redacted]d  who is admitted for pyelonephritis.    Fetal presentation is n/a. Length of Stay:  1  Days  Date of admission:06/06/2019  Subjective:   No pregnancy complaints Still with back pain Vitals:  Blood pressure (!) 89/46, pulse 60, temperature 98.7 F (37.1 C), temperature source Oral, resp. rate 16, height 5' 5.5" (1.664 m), weight 69.4 kg, last menstrual period 02/18/2019, SpO2 99 %. Vitals:   06/06/19 1931 06/06/19 2320 06/06/19 2339 06/07/19 0442  BP: (!) 94/50 (!) 82/44 (!) 97/57 (!) 89/46  Pulse: 69 (!) 59 61 60  Resp: 17 18  16   Temp: 98.3 F (36.8 C) 98.7 F (37.1 C)  98.7 F (37.1 C)  TempSrc: Oral Oral  Oral  SpO2: 100% 100%  99%  Weight:      Height:       Physical Examination:  General appearance - alert, well appearing, and in no distress Back exam - +CVAT Fundal Height:    Fetal Monitoring:  Labs:  No results found for this or any previous visit (from the past 24 hour(s)).  Imaging Studies:    No results found.   Medications:  Scheduled . docusate sodium  100 mg Oral Daily  . prenatal multivitamin  1 tablet Oral Q1200   I have reviewed the patient's current medications.    ASSESSMENT: [id number] [redacted]w[redacted]d Estimated Date of Delivery: 12/22/19  Patient Active Problem List   Diagnosis Date Noted  . Pyelonephritis affecting pregnancy in first trimester 06/06/2019  . Nausea and vomiting 10/26/2017  . Anxiety and depression 10/26/2017  . Normocytic anemia 10/26/2017  . Fecal occult blood test positive 10/26/2017    PLAN: Continue rocephin for total 72 hours likely, home tomorrow night or Monday am  Mertie Clause Devondre Guzzetta 06/07/2019,8:20 AM

## 2019-06-08 NOTE — Progress Notes (Signed)
Patient ID: Alisha Wall, female   DOB: May 30, 1991, 28 y.o.   MRN: [id number] [place]) NOTE  Alisha Wall is a 28 y.o. [id number] with Estimated Date of Delivery: 12/22/19   By   [redacted]w[redacted]d  who is admitted for pyelonephritis .    Fetal presentation is unsure. Length of Stay:  2  Days  Date of admission:06/06/2019  Subjective: Pt feels quite a bit better   Vitals:  Blood pressure (!) 81/47, pulse 64, temperature 98.5 F (36.9 C), temperature source Oral, resp. rate 17, height 5' 5.5" (1.664 m), weight 69.4 kg, last menstrual period 02/18/2019, SpO2 97 %. Vitals:   06/07/19 1647 06/07/19 1947 06/08/19 0132 06/08/19 0640  BP: 106/68 (!) 93/57 (!) 95/51 (!) 81/47  Pulse: 65 76 (!) 57 64  Resp: 18 18 17 17   Temp: 98.4 F (36.9 C) 98.5 F (36.9 C) 98.1 F (36.7 C) 98.5 F (36.9 C)  TempSrc: Oral Oral Oral Oral  SpO2: 98% 98% 97% 97%  Weight:      Height:       Physical Examination:  General appearance - alert, well appearing, and in no distress Back exam - minimal CVAT much improved  Fetal Monitoring:  Labs:  No results found for this or any previous visit (from the past 24 hour(s)).  Imaging Studies:    No results found.   Medications:  Scheduled  Current Facility-Administered Medications:  .  0.9 %  sodium chloride infusion, , Intravenous, Continuous, Seabron Spates, CNM, Last Rate: 100 mL/hr at 06/08/19 8850, New Bag at 06/08/19 2774 .  acetaminophen (TYLENOL) tablet 650 mg, 650 mg, Oral, Q4H PRN, Seabron Spates, CNM, 650 mg at 06/06/19 2326 .  calcium carbonate (TUMS - dosed in mg elemental calcium) chewable tablet 400 mg of elemental calcium, 2 tablet, Oral, Q4H PRN, Seabron Spates, CNM .  cefTRIAXone (ROCEPHIN) 2 g in sodium chloride 0.9 % 100 mL IVPB, 2 g, Intravenous, Q24H, Seabron Spates, CNM, Last Rate: 200 mL/hr at 06/08/19 0136, 2 g at 06/08/19 0136 .  docusate sodium (COLACE) capsule 100 mg, 100 mg, Oral, Daily,  Seabron Spates, CNM .  oxyCODONE-acetaminophen (PERCOCET/ROXICET) 5-325 MG per tablet 1-2 tablet, 1-2 tablet, Oral, Q6H PRN, Seabron Spates, CNM, 2 tablet at 06/07/19 1019 .  prenatal multivitamin tablet 1 tablet, 1 tablet, Oral, Q1200, Seabron Spates, CNM, Stopped at 06/06/19 1207 .  promethazine (PHENERGAN) injection 12.5 mg, 12.5 mg, Intravenous, Q4H PRN, Seabron Spates, CNM, 12.5 mg at 06/07/19 1808 .  zolpidem (AMBIEN) tablet 5 mg, 5 mg, Oral, QHS PRN, Seabron Spates, CNM . docusate sodium  100 mg Oral Daily  . prenatal multivitamin  1 tablet Oral Q1200   I have reviewed the patient's current medications.  ASSESSMENT: [id number] [redacted]w[redacted]d Estimated Date of Delivery: 12/22/19  Patient Active Problem List   Diagnosis Date Noted  . Pyelonephritis affecting pregnancy in first trimester 06/06/2019  . Nausea and vomiting 10/26/2017  . Anxiety and depression 10/26/2017  . Normocytic anemia 10/26/2017  . Fecal occult blood test positive 10/26/2017    PLAN: >1 more dose rocephin tonight 0130, plan discharge tomorrow am  Florian Buff 06/08/2019,7:28 AM

## 2019-06-09 MED ORDER — CEPHALEXIN 500 MG PO CAPS: 500.0000 mg | ORAL_CAPSULE | Freq: Four times a day (QID) | ORAL | 0 refills | Status: AC

## 2019-06-09 MED ORDER — NITROFURANTOIN MONOHYD MACRO 100 MG PO CAPS: 100.0000 mg | ORAL_CAPSULE | Freq: Every day | ORAL | 3 refills | Status: AC

## 2019-06-09 NOTE — Discharge Instructions (Signed)
Pyelonephritis, Adult ° °Pyelonephritis is an infection that occurs in the kidney. The kidneys are organs that help clean the blood by moving waste out of the blood and into the pee (urine). This infection can happen quickly, or it can last for a long time. In most cases, it clears up with treatment and does not cause other problems. °What are the causes? °This condition may be caused by: °· Germs (bacteria) going from the bladder up to the kidney. This may happen after having a bladder infection. °· Germs going from the blood to the kidney. °What increases the risk? °This condition is more likely to develop in: °· Pregnant women. °· Older people. °· People who have any of these conditions: °? Diabetes. °? Inflammation of the prostate gland (prostatitis), in males. °? Kidney stones or bladder stones. °? Other problems with the kidney or the parts of your body that carry pee from the kidneys to the bladder (ureters). °? Cancer. °· People who have a small, thin tube (catheter) placed in the bladder. °· People who are sexually active. °· Women who use a medicine that kills sperm (spermicide) to prevent pregnancy. °· People who have had a prior urinary tract infection (UTI). °What are the signs or symptoms? °Symptoms of this condition include: °· Peeing often. °· A strong urge to pee right away. °· Burning or stinging when peeing. °· Belly pain. °· Back pain. °· Pain in the side (flank area). °· Fever or chills. °· Blood in the pee, or dark pee. °· Feeling sick to your stomach (nauseous) or throwing up (vomiting). °How is this treated? °This condition may be treated by: °· Taking antibiotic medicines by mouth (orally). °· Drinking enough fluids. °If the infection is bad, you may need to stay in the hospital. You may be given antibiotics and fluids that are put directly into a vein through an IV tube. °In some cases, other treatments may be needed. °Follow these instructions at home: °Medicines °· Take your antibiotic  medicine as told by your doctor. Do not stop taking the antibiotic even if you start to feel better. °· Take over-the-counter and prescription medicines only as told by your doctor. °General instructions ° °· Drink enough fluid to keep your pee pale yellow. °· Avoid caffeine, tea, and carbonated drinks. °· Pee (urinate) often. Avoid holding in pee for long periods of time. °· Pee before and after sex. °· After pooping (having a bowel movement), women should wipe from front to back. Use each tissue only once. °· Keep all follow-up visits as told by your doctor. This is important. °Contact a doctor if: °· You do not feel better after 2 days. °· Your symptoms get worse. °· You have a fever. °Get help right away if: °· You cannot take your medicine or drink fluids as told. °· You have chills and shaking. °· You throw up. °· You have very bad pain in your side or back. °· You feel very weak or you pass out (faint). °Summary °· Pyelonephritis is an infection that occurs in the kidney. °· In most cases, this infection clears up with treatment and does not cause other problems. °· Take your antibiotic medicine as told by your doctor. Do not stop taking the antibiotic even if you start to feel better. °· Drink enough fluid to keep your pee pale yellow. °This information is not intended to replace advice given to you by your health care provider. Make sure you discuss any questions you have with   your health care provider. °Document Released: 07/13/2004 Document Revised: 04/09/2018 Document Reviewed: 04/09/2018 °Elsevier Patient Education © 2020 [employer] ° °

## 2019-06-09 NOTE — Progress Notes (Signed)
Pt discharged with printed instructions. Pt verbalized an understanding. No concerns noted. Domenik Trice L Dartanian Knaggs, RN 

## 2019-06-09 NOTE — Discharge Summary (Signed)
Physician Discharge Summary  Patient ID: ELYNA PANGILINAN MRN: [id number] DOB/AGE: February 23, 1991 28 y.o.  Admit date: 06/06/2019 Discharge date: 06/09/2019  Admission Diagnoses: pyelonephritis  Discharge Diagnoses:  Active Problems:   Pyelonephritis affecting pregnancy in first trimester   Discharged Condition: good  Hospital Course: managed with IV rocephin, responded well and received 3 days IV antibiotics with resolution of symptoms  Consults: None  Significant Diagnostic Studies: labs:   Treatments: antibiotics: ceftriaxone  Discharge Exam: Blood pressure (!) 91/58, pulse 98, temperature 98.2 F (36.8 C), temperature source Oral, resp. rate 18, height 5' 5.5" (1.664 m), weight 69.4 kg, last menstrual period 02/18/2019, SpO2 100 %. General appearance: alert, cooperative, and no distress Back: symmetric, no curvature. ROM normal. No CVA tenderness.  Disposition: Discharge disposition: 01-Home or Self Care       Discharge Instructions     Call MD for:  persistant nausea and vomiting   Complete by: As directed    Call MD for:  severe uncontrolled pain   Complete by: As directed    Call MD for:  temperature >100.4   Complete by: As directed    Diet - low sodium heart healthy   Complete by: As directed    Increase activity slowly   Complete by: As directed        [place] [facility] Follow up on 06/16/2019.   Specialty: Obstetrics and Gynecology Contact information: [address], [place] [id number] [place] [address] [phone]           Signed: Florian Buff 06/09/2019, 8:47 AM
